# Patient Record
Sex: Male | Born: 1935 | Race: White | Hispanic: No | Marital: Married | Smoking: Former smoker
Health system: Southern US, Community
[De-identification: ages and names within clinical notes are randomized; demographics above are authoritative.]

## PROBLEM LIST (undated history)

## (undated) DIAGNOSIS — E119 Type 2 diabetes mellitus without complications: Secondary | ICD-10-CM

## (undated) DIAGNOSIS — I2510 Atherosclerotic heart disease of native coronary artery without angina pectoris: Secondary | ICD-10-CM

## (undated) DIAGNOSIS — S0990XA Unspecified injury of head, initial encounter: Secondary | ICD-10-CM

## (undated) HISTORY — PX: PACEMAKER INSERTION: SHX728

## (undated) HISTORY — PX: OTHER SURGICAL HISTORY: SHX169

## (undated) HISTORY — PX: APPENDECTOMY: SHX54

## (undated) HISTORY — PX: KNEE ARTHROSCOPY: SUR90

## (undated) HISTORY — PX: INSERT / REPLACE / REMOVE PACEMAKER: SUR710

---

## 2006-12-26 ENCOUNTER — Inpatient Hospital Stay (HOSPITAL_COMMUNITY): Admission: RE | Admit: 2006-12-26 | Discharge: 2006-12-31 | Payer: Self-pay | Admitting: Orthopedic Surgery

## 2011-05-03 LAB — PROTIME-INR
INR: 3.2 — ABNORMAL HIGH
Prothrombin Time: 34.4 — ABNORMAL HIGH

## 2011-05-04 LAB — CBC
HCT: 25.9 — ABNORMAL LOW
HCT: 27.8 — ABNORMAL LOW
HCT: 35.4 — ABNORMAL LOW
Hemoglobin: 11.9 — ABNORMAL LOW
Hemoglobin: 8.8 — ABNORMAL LOW
Hemoglobin: 9.6 — ABNORMAL LOW
MCHC: 33.6
MCHC: 34.5
MCV: 89.3
MCV: 89.3
MCV: 89.5
Platelets: 129 — ABNORMAL LOW
RBC: 3.11 — ABNORMAL LOW
RBC: 3.96 — ABNORMAL LOW
RDW: 13.8
RDW: 13.8
WBC: 9.3

## 2011-05-04 LAB — BASIC METABOLIC PANEL
BUN: 15
CO2: 27
CO2: 30
Calcium: 8.7
Chloride: 101
Chloride: 105
Glucose, Bld: 198 — ABNORMAL HIGH
Glucose, Bld: 257 — ABNORMAL HIGH
Potassium: 4.4
Sodium: 135
Sodium: 137

## 2011-05-04 LAB — TYPE AND SCREEN: ABO/RH(D): A POS

## 2011-05-04 LAB — PROTIME-INR
INR: 1.5
Prothrombin Time: 14

## 2013-04-17 ENCOUNTER — Emergency Department (HOSPITAL_COMMUNITY): Payer: Medicare Other

## 2013-04-17 ENCOUNTER — Emergency Department (HOSPITAL_COMMUNITY)
Admission: EM | Admit: 2013-04-17 | Discharge: 2013-04-18 | Disposition: A | Discharge status: Home / Self Care | Payer: Medicare Other | Attending: Emergency Medicine | Admitting: Emergency Medicine

## 2013-04-17 ENCOUNTER — Encounter (HOSPITAL_COMMUNITY): Payer: Self-pay | Admitting: Adult Health

## 2013-04-17 DIAGNOSIS — Z79899 Other long term (current) drug therapy: Secondary | ICD-10-CM | POA: Insufficient documentation

## 2013-04-17 DIAGNOSIS — R197 Diarrhea, unspecified: Secondary | ICD-10-CM | POA: Insufficient documentation

## 2013-04-17 DIAGNOSIS — E876 Hypokalemia: Secondary | ICD-10-CM | POA: Insufficient documentation

## 2013-04-17 DIAGNOSIS — R51 Headache: Secondary | ICD-10-CM | POA: Insufficient documentation

## 2013-04-17 DIAGNOSIS — G8929 Other chronic pain: Secondary | ICD-10-CM | POA: Insufficient documentation

## 2013-04-17 DIAGNOSIS — Z7982 Long term (current) use of aspirin: Secondary | ICD-10-CM | POA: Insufficient documentation

## 2013-04-17 DIAGNOSIS — E119 Type 2 diabetes mellitus without complications: Secondary | ICD-10-CM | POA: Insufficient documentation

## 2013-04-17 DIAGNOSIS — I2510 Atherosclerotic heart disease of native coronary artery without angina pectoris: Secondary | ICD-10-CM | POA: Insufficient documentation

## 2013-04-17 DIAGNOSIS — Z950 Presence of cardiac pacemaker: Secondary | ICD-10-CM | POA: Insufficient documentation

## 2013-04-17 HISTORY — DX: Atherosclerotic heart disease of native coronary artery without angina pectoris: I25.10

## 2013-04-17 HISTORY — DX: Unspecified injury of head, initial encounter: S09.90XA

## 2013-04-17 HISTORY — DX: Type 2 diabetes mellitus without complications: E11.9

## 2013-04-17 LAB — CBC
HCT: 36.7 % — ABNORMAL LOW (ref 39.0–52.0)
MCH: 29.2 pg (ref 26.0–34.0)
MCV: 81.9 fL (ref 78.0–100.0)
Platelets: 181 10*3/uL (ref 150–400)
RDW: 13.1 % (ref 11.5–15.5)
WBC: 4 10*3/uL (ref 4.0–10.5)

## 2013-04-17 LAB — BASIC METABOLIC PANEL
BUN: 23 mg/dL (ref 6–23)
CO2: 24 mEq/L (ref 19–32)
Calcium: 9.4 mg/dL (ref 8.4–10.5)
Chloride: 92 mEq/L — ABNORMAL LOW (ref 96–112)
Creatinine, Ser: 1.23 mg/dL (ref 0.50–1.35)

## 2013-04-17 LAB — URINALYSIS, ROUTINE W REFLEX MICROSCOPIC
Bilirubin Urine: NEGATIVE
Glucose, UA: NEGATIVE mg/dL
Ketones, ur: NEGATIVE mg/dL
Protein, ur: NEGATIVE mg/dL
pH: 6.5 (ref 5.0–8.0)

## 2013-04-17 MED ORDER — SODIUM CHLORIDE 0.9 % IV BOLUS (SEPSIS)
1000.0000 mL | Freq: Once | INTRAVENOUS | Status: AC
  Administered 2013-04-17: 1000 mL via INTRAVENOUS

## 2013-04-17 MED ORDER — METOCLOPRAMIDE HCL 5 MG/ML IJ SOLN
5.0000 mg | Freq: Once | INTRAMUSCULAR | Status: AC
  Administered 2013-04-17: 5 mg via INTRAVENOUS
  Filled 2013-04-17: qty 2

## 2013-04-17 MED ORDER — POTASSIUM CHLORIDE ER 10 MEQ PO TBCR: 10.0000 meq | EXTENDED_RELEASE_TABLET | Freq: Two times a day (BID) | ORAL | Status: DC

## 2013-04-17 MED ORDER — DIPHENHYDRAMINE HCL 50 MG/ML IJ SOLN
12.5000 mg | Freq: Once | INTRAMUSCULAR | Status: AC
  Administered 2013-04-17: 12.5 mg via INTRAVENOUS
  Filled 2013-04-17: qty 1

## 2013-04-17 NOTE — ED Provider Notes (Signed)
CSN: [id number]     Arrival date & time 04/17/13  1555 History   First MD Initiated Contact with Patient 04/17/13 1608     Chief Complaint  Patient presents with  . Headache   (Consider location/radiation/quality/duration/timing/severity/associated sxs/prior Treatment) Patient is a 79 y.o. male presenting with headaches.  Headache Pain location:  Occipital Quality:  Dull Radiates to:  Does not radiate Onset quality:  Gradual Duration: chronically since 1998, worse over past 2-3 days. Timing:  Constant Progression:  Worsening Chronicity:  Chronic Similar to prior headaches: yes   Context: activity   Relieved by:  Nothing Worsened by:  Nothing tried Associated symptoms: diarrhea   Associated symptoms: no abdominal pain, no back pain, no congestion, no cough, no dizziness, no fever, no nausea, no photophobia, no seizures, no sinus pressure, no sore throat, no URI and no vomiting     Past Medical History  Diagnosis Date  . Diabetes mellitus without complication   . Coronary artery disease   . Head injury    Past Surgical History  Procedure Laterality Date  . Pacemaker insertion    . Knee arthroscopy     History reviewed. No pertinent family history. History  Substance Use Topics  . Smoking status: Never Smoker   . Smokeless tobacco: Not on file  . Alcohol Use: No    Review of Systems  Constitutional: Negative for fever and chills.  HENT: Negative for congestion, sore throat, rhinorrhea and sinus pressure.   Eyes: Negative for photophobia and visual disturbance.  Respiratory: Negative for cough and shortness of breath.   Cardiovascular: Negative for chest pain and leg swelling.  Gastrointestinal: Positive for diarrhea. Negative for nausea, vomiting, abdominal pain and constipation.  Endocrine: Negative for polydipsia and polyuria.  Genitourinary: Negative for dysuria and hematuria.  Musculoskeletal: Negative for back pain and arthralgias.  Skin: Negative for color  change and rash.  Neurological: Positive for headaches. Negative for dizziness, seizures, syncope and light-headedness.  Hematological: Negative for adenopathy. Does not bruise/bleed easily.  All other systems reviewed and are negative.    Allergies  Tylenol  Home Medications   Current Outpatient Rx  Name  Route  Sig  Dispense  Refill  . aspirin 81 MG tablet   Oral   Take 81 mg by mouth daily.         . Calcium-Magnesium-Vitamin D (CALCIUM 500 PO)   Oral   Take 1 tablet by mouth daily.         . Cholecalciferol (VITAMIN D PO)   Oral   Take 1 tablet by mouth daily.         . Coenzyme Q10 (CO Q 10 PO)   Oral   Take 1 tablet by mouth daily.         . Cyanocobalamin (VITAMIN B 12 PO)   Oral   Take 1 tablet by mouth daily.         Marland Kitchen lisinopril (PRINIVIL,ZESTRIL) 2.5 MG tablet   Oral   Take 2.5 mg by mouth daily.         . metFORMIN (GLUCOPHAGE) 500 MG tablet   Oral   Take 500 mg by mouth 2 (two) times daily with a meal.         . Omega-3 Fatty Acids (FISH OIL PO)   Oral   Take 1 capsule by mouth 2 (two) times daily.         . potassium chloride (K-DUR) 10 MEQ tablet   Oral   Take  1 tablet (10 mEq total) by mouth 2 (two) times daily.   6 tablet   0    BP 145/95  Pulse 83  Temp(Src) 98.7 F (37.1 C) (Oral)  Resp 18  SpO2 98% Physical Exam  Vitals reviewed. Constitutional: He is oriented to person, place, and time. He appears well-developed and well-nourished.  HENT:  Head: Normocephalic and atraumatic.  Eyes: Conjunctivae and EOM are normal.  Neck: Normal range of motion. Neck supple.  Cardiovascular: Normal rate, regular rhythm and normal heart sounds.   Pulmonary/Chest: Effort normal and breath sounds normal. No respiratory distress.  Abdominal: He exhibits no distension. There is no tenderness. There is no rebound and no guarding.  Musculoskeletal: Normal range of motion.  Neurological: He is alert and oriented to person, place, and  time. No cranial nerve deficit or sensory deficit. Gait (chronically) abnormal. GCS eye subscore is 4. GCS verbal subscore is 5. GCS motor subscore is 6.  R sided leg weakness and atrophy chronic since childhood with diminished reflexes.    Skin: Skin is warm and dry.    ED Course  Procedures (including critical care time) Labs Review Labs Reviewed  CBC - Abnormal; Notable for the following:    HCT 36.7 (*)    All other components within normal limits  BASIC METABOLIC PANEL - Abnormal; Notable for the following:    Sodium 127 (*)    Chloride 92 (*)    Glucose, Bld 135 (*)    GFR calc non Af Amer 54 (*)    GFR calc Af Amer 63 (*)    All other components within normal limits  URINALYSIS, ROUTINE W REFLEX MICROSCOPIC - Abnormal; Notable for the following:    APPearance HAZY (*)    All other components within normal limits   Imaging Review Ct Head Wo Contrast  04/17/2013   *RADIOLOGY REPORT*  Clinical Data: Posterior headache for 2 days  CT HEAD WITHOUT CONTRAST  Technique:  Contiguous axial images were obtained from the base of the skull through the vertex without contrast.  Comparison: Prior head CT 01/16/2008  Findings: No acute intracranial hemorrhage, acute infarction, mass lesion, mass effect, midline shift or hydrocephalus.  Gray-white differentiation is preserved throughout.  Slight interval progression of the cerebral and cerebellar atrophy compared to 2009.  Bilateral globes and orbits are intact and unremarkable.  No acute soft tissue abnormality.  Unremarkable calvarium without lytic or blastic osseous lesion.  Normal aeration of the bilateral mastoid air cells and visualized paranasal sinuses.  Trace atherosclerotic calcification of the cavernous carotid arteries bilaterally.  IMPRESSION:  1.  No acute intracranial abnormality. 2.  Mild interval progression of cerebral atrophy compared to 2009.   Original Report Authenticated By: Jacqulynn Cadet, M.D.    MDM   1. Headache    2. Hypokalemia    79 y.o. male  with pertinent PMH of DM, CAD, chronic ha presents with acute on chronic ha x 3 days.  HA chronic and daily for 16 years, however now bad enough that he has not been sleeping.  Has also had diarrhea over the past 3 days.  No fevers, vomiting, chest pain, dyspnea, or other findings.  Physical exam unremarkable for acute conditions, no new focal neuro deficits.  Labs and imaging as above did not reveal likely etiology of headache, although they did demonstrate hyponatremia.  NS bolus given, informed pt and family member to increase salt intake over next 2-3 days to fu with neurolgy for persistent ha.  Doubt SAH, SDH, Meningitis, or other acute pathology to explain symptoms, likely acute exacerbation of chronic process.  Given standard return precautions, agreed to fu.    Labs and imaging as above reviewed by myself and attending,Dr. Dina Rich, with whom case was discussed.   1. Headache   2. Hypokalemia         Rexene Agent, MD 04/18/13 [phone]

## 2013-04-17 NOTE — ED Notes (Signed)
Present with headache since 1998, worsening over the last 2-3 days headache is worse in occiput associated with sensitivity to sound. Denies nausea, denies sensitivity to light. Last week, on Tuesday pt reports having a episode of garbled speech that spontaneously resolved. Pt alert, oriented, no drift, droop or weakness.

## 2013-04-18 NOTE — ED Provider Notes (Signed)
I saw and evaluated the patient, reviewed the resident's note and I agree with the findings and plan.  This is a 79 year old male who presents with chronic headaches with acute worsening. Patient reports headaches for the last 16 years. He reports worsening of his headache the last 3 days. He denies any fevers, neck stiffness, nausea vomiting. He denies any weakness, numbness, or tingling. His exam is unremarkable and his neurologic exam is benign.  Patient was noted to be hyponatremic to 127. He was given a normal saline bolus. He is to followup with his doctor for recheck in one week. Patient was given headache cocktail with some improvement of his symptoms.  Of note, resident note gives diagnosis of hyperkalemia. This should read hyponatremia.  Merryl Hacker, MD 04/18/13 [phone]

## 2013-04-28 ENCOUNTER — Encounter: Payer: Self-pay | Admitting: Neurology

## 2013-04-28 ENCOUNTER — Ambulatory Visit (INDEPENDENT_AMBULATORY_CARE_PROVIDER_SITE_OTHER): Payer: Medicare Other | Admitting: Neurology

## 2013-04-28 VITALS — BP 131/82 | HR 83 | Ht 66.0 in | Wt 209.2 lb

## 2013-04-28 DIAGNOSIS — M5030 Other cervical disc degeneration, unspecified cervical region: Secondary | ICD-10-CM

## 2013-04-28 DIAGNOSIS — R51 Headache: Secondary | ICD-10-CM

## 2013-04-28 DIAGNOSIS — G243 Spasmodic torticollis: Secondary | ICD-10-CM | POA: Insufficient documentation

## 2013-04-28 NOTE — Patient Instructions (Signed)
Overall you are doing fairly well but I do want to suggest a few things today:   As far as your medications are concerned, I would like to suggest  As far as diagnostic testing: We will get a MRI of the neck to check for changes to the cervical spine  Based on the MRI we can consider different therapeutic options  I would like to see you back once the MRI is complete. Please schedule a follow up once you know your MRI date. Please call us with any interim questions, concerns, problems, updates or refill requests.   Please also call us for any test results so we can go over those with you on the phone.  My clinical assistant and will answer any of your questions and relay your messages to me and also relay most of my messages to you.   Our phone number is [phone]. We also have an after hours call service for urgent matters and there is a physician on-call for urgent questions. For any emergencies you know to call 911 or go to the nearest emergency room
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
## 2013-05-07 ENCOUNTER — Telehealth: Payer: Self-pay | Admitting: Neurology

## 2013-05-08 NOTE — Telephone Encounter (Signed)
Message left for patient. Will try again at later date.

## 2014-06-14 IMAGING — CT CT HEAD W/O CM
2 series · 16 of 30 positions shown, 20 images · non-contrast
Comparison: Prior head CT 01/16/2008

CLINICAL DATA: Posterior headache for 2 days

CT HEAD WITHOUT CONTRAST
TECHNIQUE: Contiguous axial images were obtained from the base of
the skull through the vertex without contrast.

[Series 2: head w/o · axial · non-contrast · 0.49mm/px · z∈[+73,+198]mm · 13 of 31 slices shown, 17 images]
[im 3/31  brain]
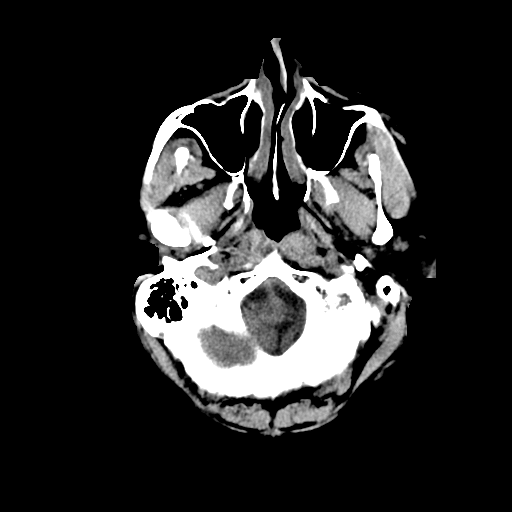
[im 3/31  bone]
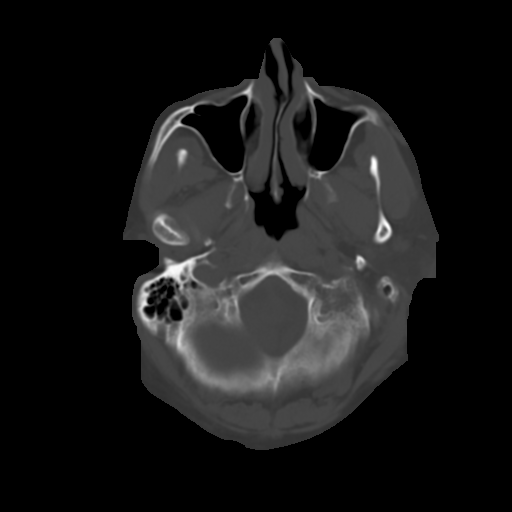
[im 5/31  brain]
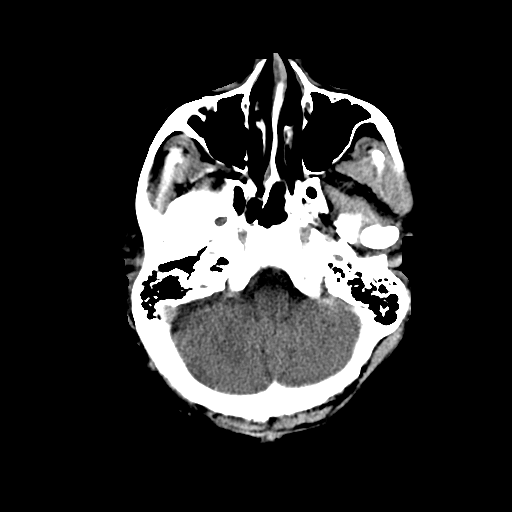
[im 7/31  brain]
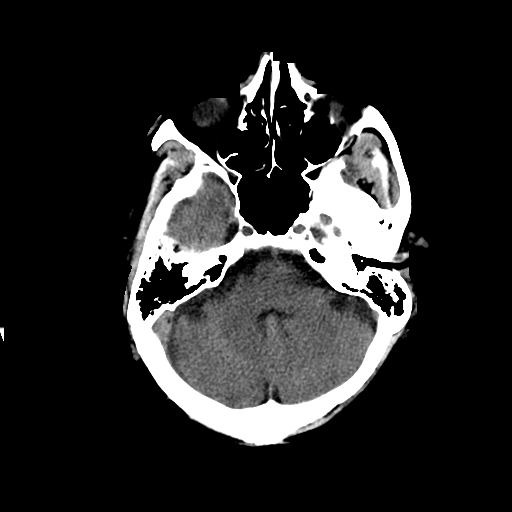
[im 9/31  brain]
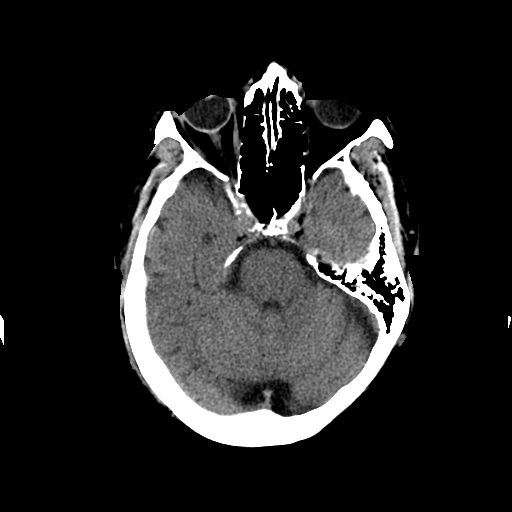
[im 11/31  brain]
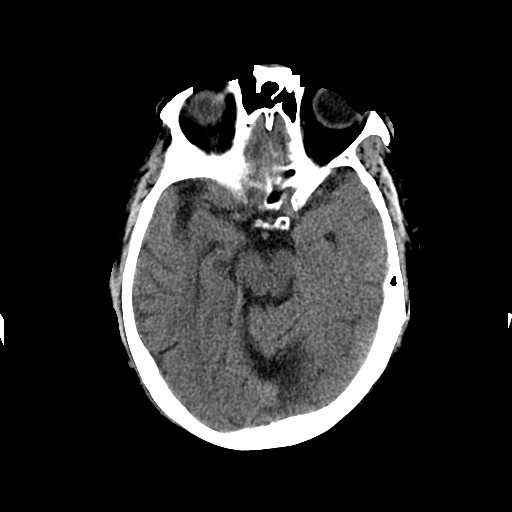
[im 11/31  bone]
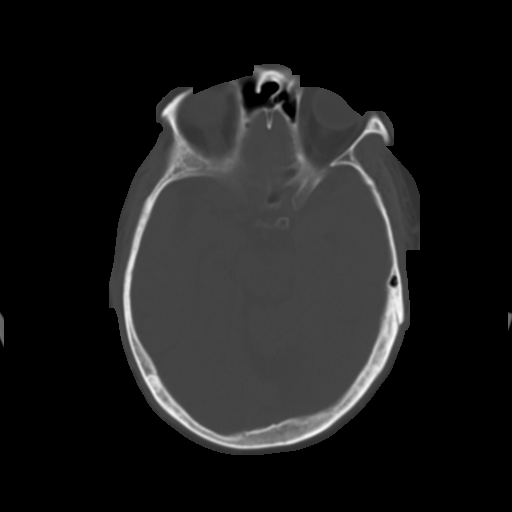
[im 13/31  brain]
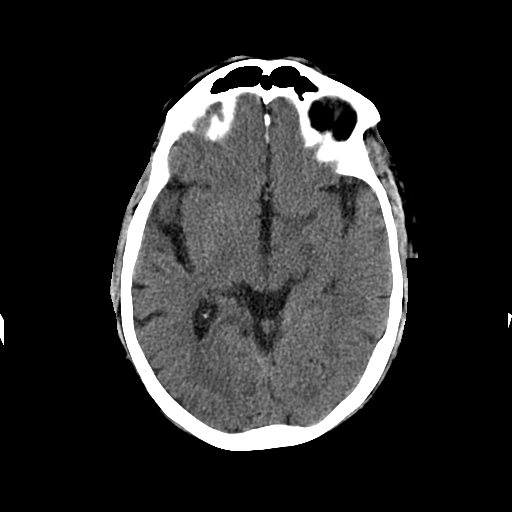
[im 16/31  brain]
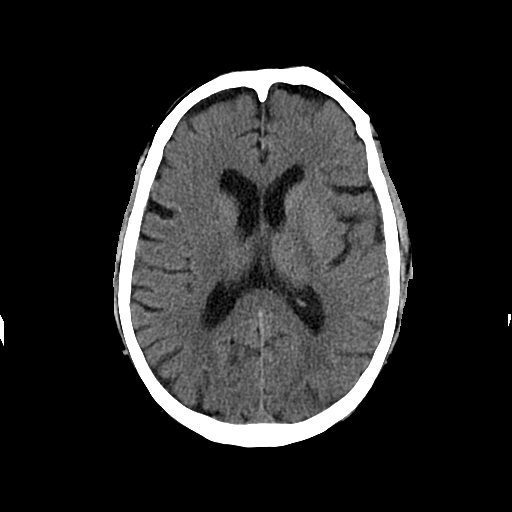
[im 18/31  brain]
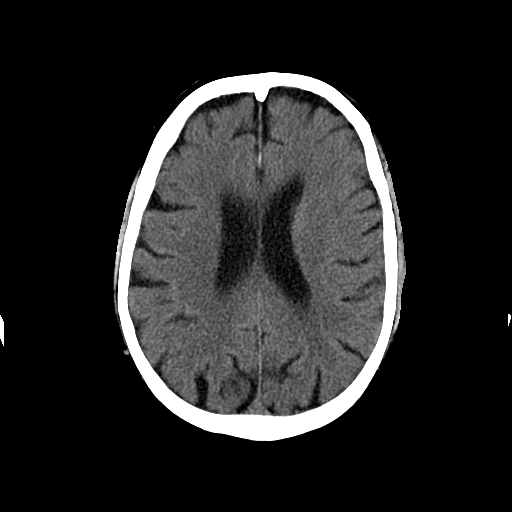
[im 20/31  brain]
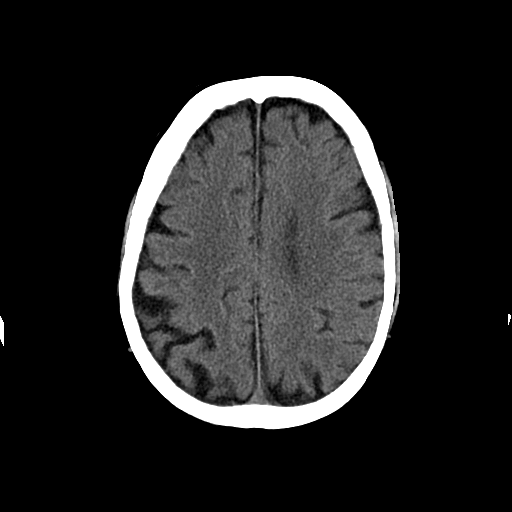
[im 20/31  bone]
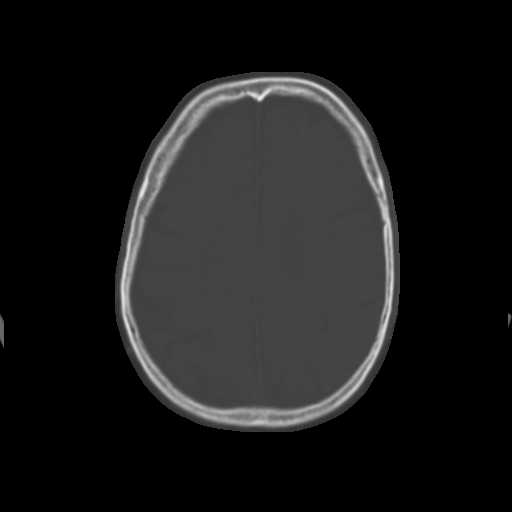
[im 22/31  brain]
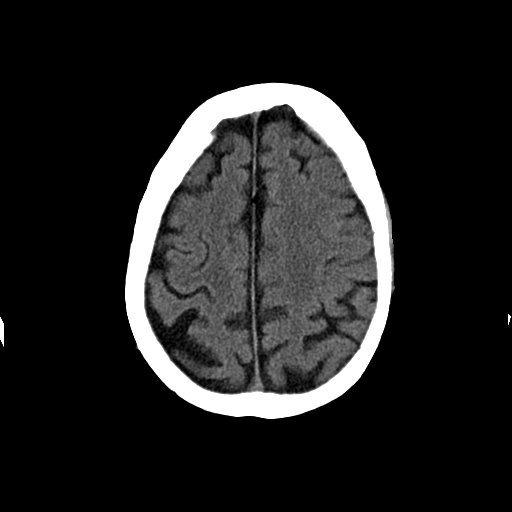
[im 24/31  brain]
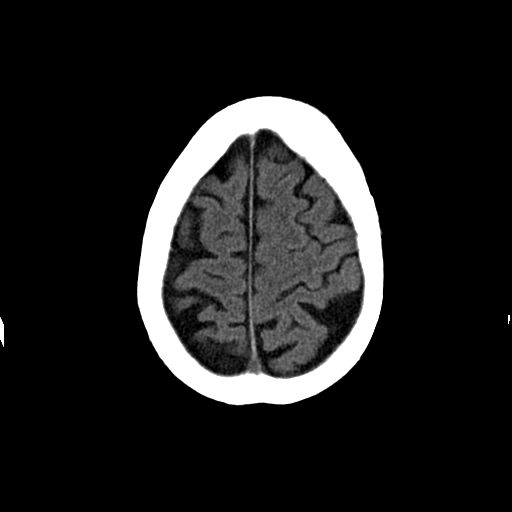
[im 26/31  brain]
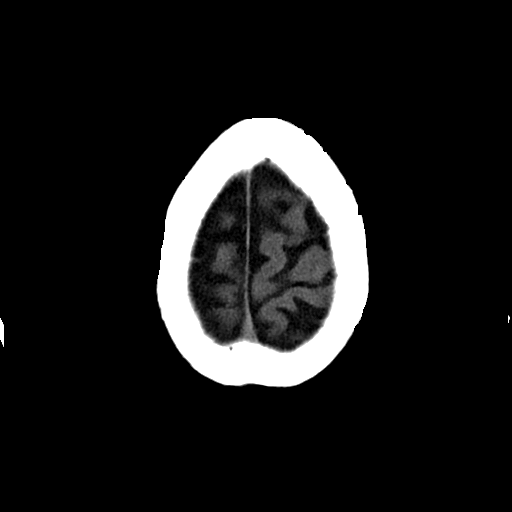
[im 28/31  brain]
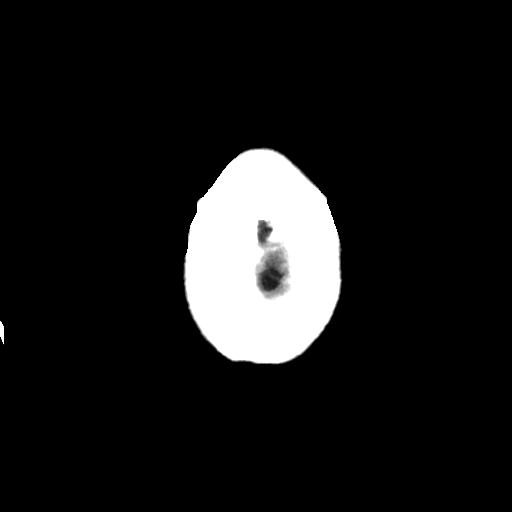
[im 28/31  bone]
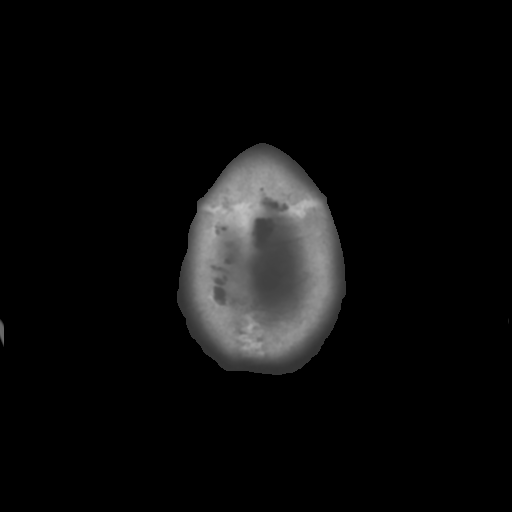

[Series 3: head w/o bone · axial · non-contrast · 0.49mm/px · z∈[+73,+113]mm · 3 of 31 slices shown]
[im 3/31  bone]
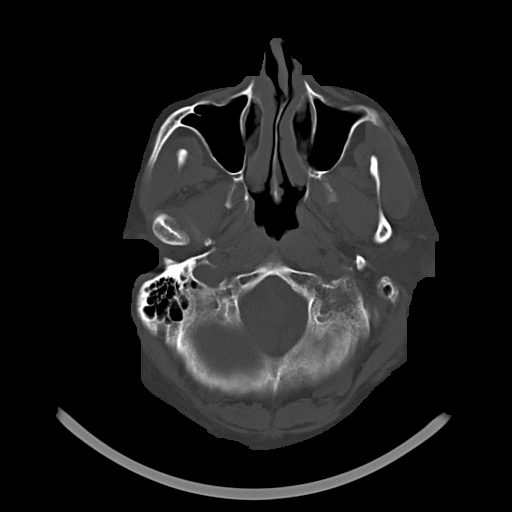
[im 7/31  bone]
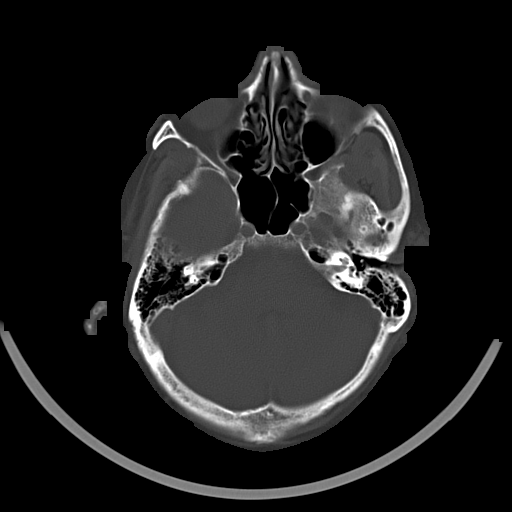
[im 11/31  bone]
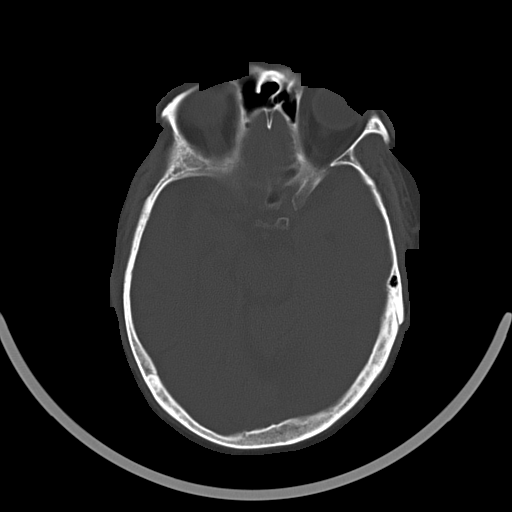

[16 of 30 positions shown; findings below may reference images not displayed]

FINDINGS: No acute intracranial hemorrhage, acute infarction, mass
lesion, mass effect, midline shift or hydrocephalus.  Gray-white
differentiation is preserved throughout.  Slight interval
progression of the cerebral and cerebellar atrophy compared to
4221.  Bilateral globes and orbits are intact and unremarkable.  No
acute soft tissue abnormality.  Unremarkable calvarium without
lytic or blastic osseous lesion.  Normal aeration of the bilateral
mastoid air cells and visualized paranasal sinuses.  Trace
atherosclerotic calcification of the cavernous carotid arteries
bilaterally.
IMPRESSION: 1.  No acute intracranial abnormality.
2.  Mild interval progression of cerebral atrophy compared to 4221.

## 2014-07-08 DIAGNOSIS — Z86711 Personal history of pulmonary embolism: Secondary | ICD-10-CM | POA: Insufficient documentation

## 2015-11-11 DIAGNOSIS — R001 Bradycardia, unspecified: Secondary | ICD-10-CM | POA: Insufficient documentation

## 2015-11-11 DIAGNOSIS — Z45018 Encounter for adjustment and management of other part of cardiac pacemaker: Secondary | ICD-10-CM | POA: Insufficient documentation

## 2016-08-04 DIAGNOSIS — R0789 Other chest pain: Secondary | ICD-10-CM | POA: Diagnosis not present

## 2016-08-04 DIAGNOSIS — R079 Chest pain, unspecified: Secondary | ICD-10-CM | POA: Diagnosis not present

## 2016-08-08 DIAGNOSIS — R001 Bradycardia, unspecified: Secondary | ICD-10-CM | POA: Diagnosis not present

## 2016-08-08 DIAGNOSIS — Z45018 Encounter for adjustment and management of other part of cardiac pacemaker: Secondary | ICD-10-CM | POA: Diagnosis not present

## 2016-08-08 DIAGNOSIS — I10 Essential (primary) hypertension: Secondary | ICD-10-CM | POA: Diagnosis not present

## 2016-08-08 DIAGNOSIS — E785 Hyperlipidemia, unspecified: Secondary | ICD-10-CM | POA: Insufficient documentation

## 2016-08-08 DIAGNOSIS — Z794 Long term (current) use of insulin: Secondary | ICD-10-CM | POA: Diagnosis not present

## 2016-08-08 DIAGNOSIS — E119 Type 2 diabetes mellitus without complications: Secondary | ICD-10-CM | POA: Insufficient documentation

## 2016-08-08 DIAGNOSIS — E784 Other hyperlipidemia: Secondary | ICD-10-CM | POA: Diagnosis not present

## 2016-08-10 DIAGNOSIS — Z9181 History of falling: Secondary | ICD-10-CM | POA: Diagnosis not present

## 2016-08-10 DIAGNOSIS — E1129 Type 2 diabetes mellitus with other diabetic kidney complication: Secondary | ICD-10-CM | POA: Diagnosis not present

## 2016-08-10 DIAGNOSIS — R809 Proteinuria, unspecified: Secondary | ICD-10-CM | POA: Diagnosis not present

## 2016-08-10 DIAGNOSIS — Z794 Long term (current) use of insulin: Secondary | ICD-10-CM | POA: Diagnosis not present

## 2016-08-10 DIAGNOSIS — N3001 Acute cystitis with hematuria: Secondary | ICD-10-CM | POA: Diagnosis not present

## 2016-08-14 DIAGNOSIS — E119 Type 2 diabetes mellitus without complications: Secondary | ICD-10-CM | POA: Diagnosis not present

## 2016-08-14 DIAGNOSIS — Z45018 Encounter for adjustment and management of other part of cardiac pacemaker: Secondary | ICD-10-CM | POA: Diagnosis not present

## 2016-08-14 DIAGNOSIS — R001 Bradycardia, unspecified: Secondary | ICD-10-CM | POA: Diagnosis not present

## 2016-08-14 DIAGNOSIS — E784 Other hyperlipidemia: Secondary | ICD-10-CM | POA: Diagnosis not present

## 2016-08-14 DIAGNOSIS — Z794 Long term (current) use of insulin: Secondary | ICD-10-CM | POA: Diagnosis not present

## 2016-08-14 DIAGNOSIS — I10 Essential (primary) hypertension: Secondary | ICD-10-CM | POA: Diagnosis not present

## 2016-09-11 DIAGNOSIS — Z45018 Encounter for adjustment and management of other part of cardiac pacemaker: Secondary | ICD-10-CM | POA: Diagnosis not present

## 2016-09-11 DIAGNOSIS — E119 Type 2 diabetes mellitus without complications: Secondary | ICD-10-CM | POA: Diagnosis not present

## 2016-09-11 DIAGNOSIS — R001 Bradycardia, unspecified: Secondary | ICD-10-CM | POA: Diagnosis not present

## 2016-09-11 DIAGNOSIS — I10 Essential (primary) hypertension: Secondary | ICD-10-CM | POA: Diagnosis not present

## 2016-09-11 DIAGNOSIS — Z794 Long term (current) use of insulin: Secondary | ICD-10-CM | POA: Diagnosis not present

## 2016-09-11 DIAGNOSIS — E784 Other hyperlipidemia: Secondary | ICD-10-CM | POA: Diagnosis not present

## 2016-09-13 DIAGNOSIS — M542 Cervicalgia: Secondary | ICD-10-CM | POA: Diagnosis not present

## 2016-09-15 DIAGNOSIS — E1129 Type 2 diabetes mellitus with other diabetic kidney complication: Secondary | ICD-10-CM | POA: Diagnosis not present

## 2016-09-22 DIAGNOSIS — I10 Essential (primary) hypertension: Secondary | ICD-10-CM | POA: Diagnosis not present

## 2016-09-22 DIAGNOSIS — E119 Type 2 diabetes mellitus without complications: Secondary | ICD-10-CM | POA: Diagnosis not present

## 2016-09-22 DIAGNOSIS — E784 Other hyperlipidemia: Secondary | ICD-10-CM | POA: Diagnosis not present

## 2016-09-22 DIAGNOSIS — Z45018 Encounter for adjustment and management of other part of cardiac pacemaker: Secondary | ICD-10-CM | POA: Diagnosis not present

## 2016-09-22 DIAGNOSIS — R001 Bradycardia, unspecified: Secondary | ICD-10-CM | POA: Diagnosis not present

## 2016-09-22 DIAGNOSIS — Z794 Long term (current) use of insulin: Secondary | ICD-10-CM | POA: Diagnosis not present

## 2016-09-22 DIAGNOSIS — E785 Hyperlipidemia, unspecified: Secondary | ICD-10-CM | POA: Diagnosis not present

## 2016-09-28 DIAGNOSIS — E1165 Type 2 diabetes mellitus with hyperglycemia: Secondary | ICD-10-CM | POA: Diagnosis not present

## 2016-09-28 DIAGNOSIS — Z8781 Personal history of (healed) traumatic fracture: Secondary | ICD-10-CM | POA: Diagnosis not present

## 2016-09-28 DIAGNOSIS — E1129 Type 2 diabetes mellitus with other diabetic kidney complication: Secondary | ICD-10-CM | POA: Diagnosis not present

## 2016-09-28 DIAGNOSIS — Z6834 Body mass index (BMI) 34.0-34.9, adult: Secondary | ICD-10-CM | POA: Diagnosis not present

## 2016-09-28 DIAGNOSIS — G4700 Insomnia, unspecified: Secondary | ICD-10-CM | POA: Diagnosis not present

## 2016-09-28 DIAGNOSIS — M1711 Unilateral primary osteoarthritis, right knee: Secondary | ICD-10-CM | POA: Diagnosis not present

## 2016-09-28 DIAGNOSIS — Z8739 Personal history of other diseases of the musculoskeletal system and connective tissue: Secondary | ICD-10-CM | POA: Diagnosis not present

## 2016-10-06 DIAGNOSIS — R2689 Other abnormalities of gait and mobility: Secondary | ICD-10-CM | POA: Diagnosis not present

## 2016-10-06 DIAGNOSIS — M6281 Muscle weakness (generalized): Secondary | ICD-10-CM | POA: Diagnosis not present

## 2016-10-11 DIAGNOSIS — E1129 Type 2 diabetes mellitus with other diabetic kidney complication: Secondary | ICD-10-CM | POA: Diagnosis not present

## 2016-10-11 DIAGNOSIS — M6281 Muscle weakness (generalized): Secondary | ICD-10-CM | POA: Diagnosis not present

## 2016-10-11 DIAGNOSIS — E1165 Type 2 diabetes mellitus with hyperglycemia: Secondary | ICD-10-CM | POA: Diagnosis not present

## 2016-10-11 DIAGNOSIS — R2689 Other abnormalities of gait and mobility: Secondary | ICD-10-CM | POA: Diagnosis not present

## 2016-10-16 DIAGNOSIS — M6281 Muscle weakness (generalized): Secondary | ICD-10-CM | POA: Diagnosis not present

## 2016-10-16 DIAGNOSIS — R2689 Other abnormalities of gait and mobility: Secondary | ICD-10-CM | POA: Diagnosis not present

## 2016-10-18 DIAGNOSIS — R2689 Other abnormalities of gait and mobility: Secondary | ICD-10-CM | POA: Diagnosis not present

## 2016-10-18 DIAGNOSIS — M6281 Muscle weakness (generalized): Secondary | ICD-10-CM | POA: Diagnosis not present

## 2016-10-20 DIAGNOSIS — E1129 Type 2 diabetes mellitus with other diabetic kidney complication: Secondary | ICD-10-CM | POA: Diagnosis not present

## 2016-10-20 DIAGNOSIS — E1165 Type 2 diabetes mellitus with hyperglycemia: Secondary | ICD-10-CM | POA: Diagnosis not present

## 2016-10-20 DIAGNOSIS — Z6835 Body mass index (BMI) 35.0-35.9, adult: Secondary | ICD-10-CM | POA: Diagnosis not present

## 2016-10-23 DIAGNOSIS — M6281 Muscle weakness (generalized): Secondary | ICD-10-CM | POA: Diagnosis not present

## 2016-10-23 DIAGNOSIS — R2689 Other abnormalities of gait and mobility: Secondary | ICD-10-CM | POA: Diagnosis not present

## 2016-10-25 DIAGNOSIS — N183 Chronic kidney disease, stage 3 (moderate): Secondary | ICD-10-CM | POA: Diagnosis not present

## 2016-10-25 DIAGNOSIS — R2689 Other abnormalities of gait and mobility: Secondary | ICD-10-CM | POA: Diagnosis not present

## 2016-10-25 DIAGNOSIS — M6281 Muscle weakness (generalized): Secondary | ICD-10-CM | POA: Diagnosis not present

## 2016-10-25 DIAGNOSIS — D631 Anemia in chronic kidney disease: Secondary | ICD-10-CM | POA: Diagnosis not present

## 2016-10-25 DIAGNOSIS — Z6835 Body mass index (BMI) 35.0-35.9, adult: Secondary | ICD-10-CM | POA: Diagnosis not present

## 2016-10-25 DIAGNOSIS — N2581 Secondary hyperparathyroidism of renal origin: Secondary | ICD-10-CM | POA: Diagnosis not present

## 2016-11-01 DIAGNOSIS — M6281 Muscle weakness (generalized): Secondary | ICD-10-CM | POA: Diagnosis not present

## 2016-11-01 DIAGNOSIS — R2689 Other abnormalities of gait and mobility: Secondary | ICD-10-CM | POA: Diagnosis not present

## 2016-11-02 ENCOUNTER — Other Ambulatory Visit: Payer: Self-pay | Admitting: Nephrology

## 2016-11-02 DIAGNOSIS — N183 Chronic kidney disease, stage 3 unspecified: Secondary | ICD-10-CM

## 2016-11-08 DIAGNOSIS — R2689 Other abnormalities of gait and mobility: Secondary | ICD-10-CM | POA: Diagnosis not present

## 2016-11-08 DIAGNOSIS — M6281 Muscle weakness (generalized): Secondary | ICD-10-CM | POA: Diagnosis not present

## 2016-11-09 ENCOUNTER — Ambulatory Visit
Admission: RE | Admit: 2016-11-09 | Discharge: 2016-11-09 | Disposition: A | Discharge status: Home / Self Care | Payer: Self-pay | Source: Ambulatory Visit | Attending: Nephrology | Admitting: Nephrology

## 2016-11-09 DIAGNOSIS — N183 Chronic kidney disease, stage 3 unspecified: Secondary | ICD-10-CM

## 2016-11-09 DIAGNOSIS — N281 Cyst of kidney, acquired: Secondary | ICD-10-CM | POA: Diagnosis not present

## 2016-11-12 DIAGNOSIS — S91111A Laceration without foreign body of right great toe without damage to nail, initial encounter: Secondary | ICD-10-CM | POA: Diagnosis not present

## 2016-11-12 DIAGNOSIS — S92911B Unspecified fracture of right toe(s), initial encounter for open fracture: Secondary | ICD-10-CM | POA: Diagnosis not present

## 2016-11-12 DIAGNOSIS — S91301A Unspecified open wound, right foot, initial encounter: Secondary | ICD-10-CM | POA: Diagnosis not present

## 2016-11-12 DIAGNOSIS — S99921A Unspecified injury of right foot, initial encounter: Secondary | ICD-10-CM | POA: Diagnosis not present

## 2016-11-12 DIAGNOSIS — S92511A Displaced fracture of proximal phalanx of right lesser toe(s), initial encounter for closed fracture: Secondary | ICD-10-CM | POA: Diagnosis not present

## 2016-11-12 DIAGNOSIS — S91114A Laceration without foreign body of right lesser toe(s) without damage to nail, initial encounter: Secondary | ICD-10-CM | POA: Diagnosis not present

## 2016-11-12 DIAGNOSIS — W228XXA Striking against or struck by other objects, initial encounter: Secondary | ICD-10-CM | POA: Diagnosis not present

## 2016-11-12 DIAGNOSIS — S92501A Displaced unspecified fracture of right lesser toe(s), initial encounter for closed fracture: Secondary | ICD-10-CM | POA: Diagnosis not present

## 2016-11-13 DIAGNOSIS — R2689 Other abnormalities of gait and mobility: Secondary | ICD-10-CM | POA: Diagnosis not present

## 2016-11-13 DIAGNOSIS — M6281 Muscle weakness (generalized): Secondary | ICD-10-CM | POA: Diagnosis not present

## 2016-11-17 ENCOUNTER — Encounter: Payer: Self-pay | Admitting: Sports Medicine

## 2016-11-17 ENCOUNTER — Ambulatory Visit (INDEPENDENT_AMBULATORY_CARE_PROVIDER_SITE_OTHER): Payer: PPO | Admitting: Sports Medicine

## 2016-11-17 DIAGNOSIS — M79674 Pain in right toe(s): Secondary | ICD-10-CM | POA: Diagnosis not present

## 2016-11-17 DIAGNOSIS — S92501A Displaced unspecified fracture of right lesser toe(s), initial encounter for closed fracture: Secondary | ICD-10-CM

## 2016-11-17 DIAGNOSIS — E1142 Type 2 diabetes mellitus with diabetic polyneuropathy: Secondary | ICD-10-CM | POA: Diagnosis not present
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
## 2016-11-20 DIAGNOSIS — M6281 Muscle weakness (generalized): Secondary | ICD-10-CM | POA: Diagnosis not present

## 2016-11-20 DIAGNOSIS — R2689 Other abnormalities of gait and mobility: Secondary | ICD-10-CM | POA: Diagnosis not present

## 2016-11-22 ENCOUNTER — Ambulatory Visit: Payer: PPO | Admitting: Sports Medicine

## 2016-11-23 DIAGNOSIS — H43811 Vitreous degeneration, right eye: Secondary | ICD-10-CM | POA: Diagnosis not present

## 2016-11-23 DIAGNOSIS — H43813 Vitreous degeneration, bilateral: Secondary | ICD-10-CM | POA: Diagnosis not present

## 2016-11-23 DIAGNOSIS — R001 Bradycardia, unspecified: Secondary | ICD-10-CM | POA: Diagnosis not present

## 2016-11-23 DIAGNOSIS — H52223 Regular astigmatism, bilateral: Secondary | ICD-10-CM | POA: Diagnosis not present

## 2016-11-23 DIAGNOSIS — Z45018 Encounter for adjustment and management of other part of cardiac pacemaker: Secondary | ICD-10-CM | POA: Diagnosis not present

## 2016-11-23 DIAGNOSIS — H524 Presbyopia: Secondary | ICD-10-CM | POA: Diagnosis not present

## 2016-11-23 DIAGNOSIS — H5213 Myopia, bilateral: Secondary | ICD-10-CM | POA: Diagnosis not present

## 2016-11-23 DIAGNOSIS — E119 Type 2 diabetes mellitus without complications: Secondary | ICD-10-CM | POA: Diagnosis not present

## 2016-12-01 ENCOUNTER — Ambulatory Visit (INDEPENDENT_AMBULATORY_CARE_PROVIDER_SITE_OTHER): Payer: PPO

## 2016-12-01 ENCOUNTER — Ambulatory Visit (INDEPENDENT_AMBULATORY_CARE_PROVIDER_SITE_OTHER): Payer: PPO | Admitting: Sports Medicine

## 2016-12-01 DIAGNOSIS — S92501A Displaced unspecified fracture of right lesser toe(s), initial encounter for closed fracture: Secondary | ICD-10-CM

## 2016-12-01 DIAGNOSIS — M79674 Pain in right toe(s): Secondary | ICD-10-CM

## 2016-12-01 DIAGNOSIS — S92501D Displaced unspecified fracture of right lesser toe(s), subsequent encounter for fracture with routine healing: Secondary | ICD-10-CM

## 2016-12-01 DIAGNOSIS — E1142 Type 2 diabetes mellitus with diabetic polyneuropathy: Secondary | ICD-10-CM | POA: Diagnosis not present

## 2016-12-01 NOTE — Progress Notes (Signed)
Subjective: Victor Murphy is a 83 y.o. male patient who returns to office for follow-up evaluation of Right toe pain and to have his sutures removed that were placed by the emergency department for skin tear. Patient is also here for x-rays to follow-up toe fracture that is present. Patient reports that there is no soreness and is doing better with physical therapy. Patient denies any other pedal complaints  FBS in the 200 range  Patient Active Problem List   Diagnosis Date Noted  . Chronic daily headache 04/28/2013  . Cervical dystonia 04/28/2013    Current Outpatient Prescriptions on File Prior to Visit  Medication Sig Dispense Refill  . allopurinol (ZYLOPRIM) 300 MG tablet     . ALPRAZolam (XANAX) 1 MG tablet     . aspirin 81 MG tablet Take 81 mg by mouth daily.    . Calcium-Magnesium-Vitamin D (CALCIUM 500 PO) Take 1 tablet by mouth daily.    . Cholecalciferol (VITAMIN D PO) Take 1 tablet by mouth daily.    . Coenzyme Q10 (CO Q 10 PO) Take 1 tablet by mouth daily.    . Cyanocobalamin (VITAMIN B 12 PO) Take 1 tablet by mouth daily.    . divalproex (DEPAKOTE) 250 MG DR tablet Take 250 mg by mouth.    . donepezil (ARICEPT) 10 MG tablet Take 10 mg by mouth.    Marland Kitchen glimepiride (AMARYL) 4 MG tablet Take 4 mg by mouth.    . Insulin Glargine (BASAGLAR KWIKPEN) 100 UNIT/ML SOPN Inject into the skin.    Marland Kitchen levothyroxine (SYNTHROID, LEVOTHROID) 25 MCG tablet Take by mouth.    Marland Kitchen lisinopril (PRINIVIL,ZESTRIL) 2.5 MG tablet Take 2.5 mg by mouth daily.    . memantine (NAMENDA) 10 MG tablet Take 10 mg by mouth.    . metFORMIN (GLUCOPHAGE) 500 MG tablet Take 500 mg by mouth 2 (two) times daily with a meal.    . Multiple Vitamin (MULTIVITAMIN) tablet Take 1 tablet by mouth daily.    . Omega-3 Fatty Acids (FISH OIL PO) Take 1 capsule by mouth 2 (two) times daily.    Marland Kitchen topiramate (TOPAMAX) 25 MG tablet      No current facility-administered medications on file prior to visit.     Allergies  Allergen  Reactions  . Tylenol [Acetaminophen] Other (See Comments)    Makes him "hyper"    Objective:  General: Alert and oriented x3 in no acute distress  Dermatology: Sutures intact plantar right 3rd toe, No open lesions bilateral lower extremities, no webspace macerations, + right forefoot swelling, no ecchymosis bilateral, all nails x 10 are well manicured.  Vascular: Dorsalis Pedis and Posterior Tibial pedal pulses palpable 1/4 bilateral, Capillary Fill Time 3 seconds,no pedal hair growth bilateral, Temperature gradient within normal limits.  Neurology: Johney Maine sensation intact via light touch bilateral, Protective sensation diminishedwith Semmes Weinstein Monofilament to all pedal sites, Position sense intact, vibratory diminished bilateral. (- )Tinels sign bilateral.   Musculoskeletal: Very minimal tenderness with palpation at 3rd toe on right foot. Right limb length discrepancy with the right leg been significantly shorter than the left.  Xrays right foot reveal almost completely healed nondisplaced fracture at proximal phalanx extending into the PIPJ at 3rd toe.   Assessment and Plan: Problem List Items Addressed This Visit    None    Visit Diagnoses    Closed fracture of phalanx of right third toe with routine healing, subsequent encounter    -  Primary   Relevant Orders   DG  Foot Complete Right   Toe pain, right       Diabetic polyneuropathy associated with type 2 diabetes mellitus ([place])          -Complete examination performed -Xrays reviewed -Discussed  continued plan of care for prematurely healed fracture, right third toe -May discontinue buddy taping the toes -Sutures removed and a dry sterile sock was applied. Patient may shower as normal. -Continue with normal custom shoe and physical therapy -Patient to return to office in 4 weeks for final xray or sooner if condition worsens.  Landis Martins, DPM

## 2016-12-12 DIAGNOSIS — E785 Hyperlipidemia, unspecified: Secondary | ICD-10-CM | POA: Diagnosis not present

## 2016-12-12 DIAGNOSIS — I10 Essential (primary) hypertension: Secondary | ICD-10-CM | POA: Diagnosis not present

## 2016-12-12 DIAGNOSIS — Z794 Long term (current) use of insulin: Secondary | ICD-10-CM | POA: Diagnosis not present

## 2016-12-12 DIAGNOSIS — R001 Bradycardia, unspecified: Secondary | ICD-10-CM | POA: Diagnosis not present

## 2016-12-12 DIAGNOSIS — E119 Type 2 diabetes mellitus without complications: Secondary | ICD-10-CM | POA: Diagnosis not present

## 2016-12-12 DIAGNOSIS — Z45018 Encounter for adjustment and management of other part of cardiac pacemaker: Secondary | ICD-10-CM | POA: Diagnosis not present

## 2016-12-29 ENCOUNTER — Ambulatory Visit (INDEPENDENT_AMBULATORY_CARE_PROVIDER_SITE_OTHER): Payer: PPO | Admitting: Sports Medicine

## 2016-12-29 ENCOUNTER — Ambulatory Visit (INDEPENDENT_AMBULATORY_CARE_PROVIDER_SITE_OTHER): Payer: PPO

## 2016-12-29 DIAGNOSIS — E1142 Type 2 diabetes mellitus with diabetic polyneuropathy: Secondary | ICD-10-CM | POA: Diagnosis not present

## 2016-12-29 DIAGNOSIS — S92501D Displaced unspecified fracture of right lesser toe(s), subsequent encounter for fracture with routine healing: Secondary | ICD-10-CM | POA: Diagnosis not present

## 2016-12-29 DIAGNOSIS — M79674 Pain in right toe(s): Secondary | ICD-10-CM | POA: Diagnosis not present

## 2016-12-29 NOTE — Progress Notes (Signed)
Subjective: Victor Murphy is a 83 y.o. male patient who returns to office for follow-up evaluation of Right toe pain secondary to fracture injury with skin laceration. Patient reports that it feels good, no issues, stopped physical therapy because it was "wearing him out". Patient denies any other pedal complaints  FBS in the 200 range, unchanged from prior  Patient Active Problem List   Diagnosis Date Noted  . Chronic daily headache 04/28/2013  . Cervical dystonia 04/28/2013    Current Outpatient Prescriptions on File Prior to Visit  Medication Sig Dispense Refill  . allopurinol (ZYLOPRIM) 300 MG tablet     . ALPRAZolam (XANAX) 1 MG tablet     . aspirin 81 MG tablet Take 81 mg by mouth daily.    . Calcium-Magnesium-Vitamin D (CALCIUM 500 PO) Take 1 tablet by mouth daily.    . Cholecalciferol (VITAMIN D PO) Take 1 tablet by mouth daily.    . Coenzyme Q10 (CO Q 10 PO) Take 1 tablet by mouth daily.    . Cyanocobalamin (VITAMIN B 12 PO) Take 1 tablet by mouth daily.    . divalproex (DEPAKOTE) 250 MG DR tablet Take 250 mg by mouth.    . donepezil (ARICEPT) 10 MG tablet Take 10 mg by mouth.    Marland Kitchen glimepiride (AMARYL) 4 MG tablet Take 4 mg by mouth.    . Insulin Glargine (BASAGLAR KWIKPEN) 100 UNIT/ML SOPN Inject into the skin.    Marland Kitchen levothyroxine (SYNTHROID, LEVOTHROID) 25 MCG tablet Take by mouth.    Marland Kitchen lisinopril (PRINIVIL,ZESTRIL) 2.5 MG tablet Take 2.5 mg by mouth daily.    . memantine (NAMENDA) 10 MG tablet Take 10 mg by mouth.    . metFORMIN (GLUCOPHAGE) 500 MG tablet Take 500 mg by mouth 2 (two) times daily with a meal.    . Multiple Vitamin (MULTIVITAMIN) tablet Take 1 tablet by mouth daily.    . Omega-3 Fatty Acids (FISH OIL PO) Take 1 capsule by mouth 2 (two) times daily.    Marland Kitchen topiramate (TOPAMAX) 25 MG tablet      No current facility-administered medications on file prior to visit.     Allergies  Allergen Reactions  . Tylenol [Acetaminophen] Other (See Comments)    Makes him  "hyper"    Objective:  General: Alert and oriented x3 in no acute distress  Dermatology: Laceration well healed at right 3rd toe, No open lesions bilateral lower extremities, no webspace macerations, Resolved right forefoot swelling, no ecchymosis bilateral, all nails x 10 are well manicured.  Vascular: Dorsalis Pedis and Posterior Tibial pedal pulses palpable 1/4 bilateral, Capillary Fill Time 3 seconds,no pedal hair growth bilateral, Temperature gradient within normal limits.  Neurology: Johney Maine sensation intact via light touch bilateral, Protective sensation diminishedwith Semmes Weinstein Monofilament to all pedal sites, Position sense intact, vibratory diminished bilateral. (- )Tinels sign bilateral.   Musculoskeletal: No tenderness with palpation at 3rd toe on right foot. Right limb length discrepancy with the right leg been significantly shorter than the left.  Xrays right foot reveals completely healed fracture at proximal phalanx at 3rd toe.   Assessment and Plan: Problem List Items Addressed This Visit    None    Visit Diagnoses    Closed fracture of phalanx of right third toe with routine healing, subsequent encounter    -  Primary   healed   Relevant Orders   DG Foot Complete Right   Toe pain, right       Diabetic polyneuropathy associated with type  2 diabetes mellitus ([place])          -Complete examination performed -Xrays reviewed -Fracture at 3rd toe is healed -Continue with normal custom shoe on right -Continue with walker for stability in gait and to rest when ambulating to prevent from feeling tired -Patient wants to continue to have family and PCP to provide diabetic foot care -Patient to return to office as needed   Landis Martins, DPM

## 2017-01-11 DIAGNOSIS — E1165 Type 2 diabetes mellitus with hyperglycemia: Secondary | ICD-10-CM | POA: Diagnosis not present

## 2017-01-11 DIAGNOSIS — E1129 Type 2 diabetes mellitus with other diabetic kidney complication: Secondary | ICD-10-CM | POA: Diagnosis not present

## 2017-01-19 DIAGNOSIS — N183 Chronic kidney disease, stage 3 (moderate): Secondary | ICD-10-CM | POA: Diagnosis not present

## 2017-01-19 DIAGNOSIS — D631 Anemia in chronic kidney disease: Secondary | ICD-10-CM | POA: Diagnosis not present

## 2017-01-19 DIAGNOSIS — N2581 Secondary hyperparathyroidism of renal origin: Secondary | ICD-10-CM | POA: Diagnosis not present

## 2017-01-19 DIAGNOSIS — E669 Obesity, unspecified: Secondary | ICD-10-CM | POA: Diagnosis not present

## 2017-01-24 DIAGNOSIS — M8580 Other specified disorders of bone density and structure, unspecified site: Secondary | ICD-10-CM | POA: Diagnosis not present

## 2017-01-24 DIAGNOSIS — Z9889 Other specified postprocedural states: Secondary | ICD-10-CM | POA: Diagnosis not present

## 2017-01-24 DIAGNOSIS — Z967 Presence of other bone and tendon implants: Secondary | ICD-10-CM | POA: Diagnosis not present

## 2017-01-24 DIAGNOSIS — M1711 Unilateral primary osteoarthritis, right knee: Secondary | ICD-10-CM | POA: Diagnosis not present

## 2017-01-24 DIAGNOSIS — Z4789 Encounter for other orthopedic aftercare: Secondary | ICD-10-CM | POA: Diagnosis not present

## 2017-01-24 DIAGNOSIS — Z8781 Personal history of (healed) traumatic fracture: Secondary | ICD-10-CM | POA: Diagnosis not present

## 2017-01-25 DIAGNOSIS — E1129 Type 2 diabetes mellitus with other diabetic kidney complication: Secondary | ICD-10-CM | POA: Diagnosis not present

## 2017-01-25 DIAGNOSIS — E1165 Type 2 diabetes mellitus with hyperglycemia: Secondary | ICD-10-CM | POA: Diagnosis not present

## 2017-01-30 DIAGNOSIS — E782 Mixed hyperlipidemia: Secondary | ICD-10-CM | POA: Diagnosis not present

## 2017-01-30 DIAGNOSIS — Z79899 Other long term (current) drug therapy: Secondary | ICD-10-CM | POA: Diagnosis not present

## 2017-01-30 DIAGNOSIS — E1140 Type 2 diabetes mellitus with diabetic neuropathy, unspecified: Secondary | ICD-10-CM | POA: Diagnosis not present

## 2017-02-08 DIAGNOSIS — E1140 Type 2 diabetes mellitus with diabetic neuropathy, unspecified: Secondary | ICD-10-CM | POA: Diagnosis not present

## 2017-02-08 DIAGNOSIS — W57XXXA Bitten or stung by nonvenomous insect and other nonvenomous arthropods, initial encounter: Secondary | ICD-10-CM | POA: Diagnosis not present

## 2017-02-08 DIAGNOSIS — E1165 Type 2 diabetes mellitus with hyperglycemia: Secondary | ICD-10-CM | POA: Diagnosis not present

## 2017-02-08 DIAGNOSIS — Z6836 Body mass index (BMI) 36.0-36.9, adult: Secondary | ICD-10-CM | POA: Diagnosis not present

## 2017-02-22 ENCOUNTER — Ambulatory Visit: Payer: Self-pay | Admitting: Cardiology

## 2017-02-22 ENCOUNTER — Ambulatory Visit (INDEPENDENT_AMBULATORY_CARE_PROVIDER_SITE_OTHER): Payer: Self-pay | Admitting: Cardiology

## 2017-02-22 ENCOUNTER — Encounter: Payer: Self-pay | Admitting: Cardiology

## 2017-02-22 VITALS — BP 110/60 | HR 88 | Resp 10 | Ht 67.0 in | Wt 212.0 lb

## 2017-02-22 DIAGNOSIS — Z45018 Encounter for adjustment and management of other part of cardiac pacemaker: Secondary | ICD-10-CM

## 2017-02-22 DIAGNOSIS — E785 Hyperlipidemia, unspecified: Secondary | ICD-10-CM

## 2017-02-22 DIAGNOSIS — E119 Type 2 diabetes mellitus without complications: Secondary | ICD-10-CM

## 2017-02-22 DIAGNOSIS — I10 Essential (primary) hypertension: Secondary | ICD-10-CM

## 2017-02-22 DIAGNOSIS — R001 Bradycardia, unspecified: Secondary | ICD-10-CM

## 2017-02-22 MED ORDER — ATORVASTATIN CALCIUM 10 MG PO TABS: 10.0000 mg | ORAL_TABLET | Freq: Every day | ORAL | 3 refills | Status: DC

## 2017-02-22 NOTE — Progress Notes (Signed)
Cardiology Office Note:    Date:  02/22/2017   ID:  Tamarick Kovalcik, DOB Mar 22, 1933, MRN [id number]  PCP:  Myer Peer, MD  Cardiologist:  Jenne Campus, MD    Referring MD: Myer Peer, MD   Chief Complaint  Patient presents with  . Follow-up  I'm doing fine  History of Present Illness:    Victor Murphy is a 83 y.o. male  with pacemaker, diabetes, dyslipidemia. He presents to our office for regular follow-up. He does have some degree of dementia and his wife talks for him most of the time. He denies having any problems no shortness of breath chest pain tightness squeezing pressure burning chest. He does not do much. He said he spent his time sitting in the chair. He used to swim a lot but doesn't do it anymore. Recently he was fine to have very uncontrolled diabetes that is being adjusted. He's not cholesterol medication and according to old guidelines he needs to be taking high intensity statin. I will give him Lipitor 10 mg daily.  Pacemaker be interrogated. Normal functional battery status is good she gets 8 years left and device.  Past Medical History:  Diagnosis Date  . Coronary artery disease   . Diabetes mellitus without complication ([place])   . Head injury     Past Surgical History:  Procedure Laterality Date  . APPENDECTOMY    . broken right leg    . INSERT / REPLACE / REMOVE PACEMAKER     [employer]  . KNEE ARTHROSCOPY    . PACEMAKER INSERTION      Current Medications: Current Meds  Medication Sig  . aspirin 81 MG tablet Take 81 mg by mouth daily.  Marland Kitchen donepezil (ARICEPT) 10 MG tablet Take 10 mg by mouth.  Marland Kitchen glimepiride (AMARYL) 4 MG tablet Take 4 mg by mouth.  . Insulin Glargine (BASAGLAR KWIKPEN) 100 UNIT/ML SOPN Inject 20 Units into the skin.   Marland Kitchen levothyroxine (SYNTHROID, LEVOTHROID) 25 MCG tablet Take by mouth.  . memantine (NAMENDA) 10 MG tablet Take 10 mg by mouth 2 (two) times daily.      Allergies:   Tylenol [acetaminophen]   Social History    Social History  . Marital status: Married    Spouse name: [name]   . Number of children: 0  . Years of education: 12   Occupational History  . disabled    . retired     Social History Main Topics  . Smoking status: Former Research scientist (life sciences)  . Smokeless tobacco: Never Used  . Alcohol use No  . Drug use: No  . Sexual activity: Not Asked   Other Topics Concern  . None   Social History Narrative   Patient lives at home with wife [name].    Patient is retired and disabled.    Patient has no children.    Patient has a 12 grade education.            Family History: The patient's family history is not on file. ROS:   Please see the history of present illness.    All 14 point review of systems negative except as described per history of present illness  EKGs/Labs/Other Studies Reviewed:      Recent Labs: No results found for requested labs within last 8760 hours.  Recent Lipid Panel No results found for: CHOL, TRIG, HDL, CHOLHDL, VLDL, LDLCALC, LDLDIRECT  Physical Exam:    VS:  BP 110/60   Pulse 88   Resp 10  Ht 5\' 7"  (1.702 m)   Wt 212 lb (96.2 kg) Comment: reported, wheel chair  BMI 33.20 kg/m     Wt Readings from Last 3 Encounters:  02/22/17 212 lb (96.2 kg)  04/28/13 209 lb 4 oz (94.9 kg)     GEN:  Well nourished, well developed in no acute distress HEENT: Normal NECK: No JVD; No carotid bruits LYMPHATICS: No lymphadenopathy CARDIAC: RRR, no murmurs, no rubs, no gallops RESPIRATORY:  Clear to auscultation without rales, wheezing or rhonchi  ABDOMEN: Soft, non-tender, non-distended MUSCULOSKELETAL:  No edema; No deformity  SKIN: Warm and dry LOWER EXTREMITIES: no swelling NEUROLOGIC:  Alert and oriented x 3 PSYCHIATRIC:  Normal affect   ASSESSMENT:    1. Essential hypertension   2. Type 2 diabetes mellitus without complication, without long-term current use of insulin ([place])   3. Bradycardia   4. Dyslipidemia (high LDL; low HDL)   5. Pacemaker  reprogramming/check    PLAN:    In order of problems listed above:  1. Essential hypertension: Blood pressure appears to be controlled we'll continue present management. 2. Type 2 diabetes: The better from that point review previously his diabetes is very poorly controlled. 3. Bradycardia: This being addressed with pacemaker. 4. Dyslipidemia: I he finally agreed to take statin. We'll give him 10 mg of Lipitor. I talked to him that ideally he need to be taking high intensity statin he's reluctant to do that. 5. Pacemaker: We'll check device today.   Medication Adjustments/Labs and Tests Ordered: Current medicines are reviewed at length with the patient today.  Concerns regarding medicines are outlined above.  No orders of the defined types were placed in this encounter.  Medication changes: No orders of the defined types were placed in this encounter.   Signed, Park Liter, MD, [facility] 02/22/2017 11:46 AM    [place]

## 2017-02-22 NOTE — Patient Instructions (Addendum)
Medication Instructions:  Your physician has recommended you make the following change in your medication:  1.) START Lipitor (atorovastatin) 10 mg daily.   Labwork: None   Testing/Procedures: Device interrogation today in office.   Follow-Up: Your physician wants you to follow-up in: 1 Year You will receive a reminder letter in the mail two months in advance. If you don't receive a letter, please call our office to schedule the follow-up appointment.  Any Other Special Instructions Will Be Listed Below (If Applicable).  Please note that any paperwork needing to be filled out by the provider will need to be addressed at the front desk prior to seeing the provider. Please note that any paperwork FMLA, Disability or other documents regarding health condition is subject to a $25.00 charge that must be received prior to completion of paperwork.    If you need a refill on your cardiac medications before your next appointment, please call your pharmacy.

## 2017-03-01 DIAGNOSIS — N183 Chronic kidney disease, stage 3 (moderate): Secondary | ICD-10-CM | POA: Diagnosis not present

## 2017-03-16 DIAGNOSIS — A849 Tick-borne viral encephalitis, unspecified: Secondary | ICD-10-CM | POA: Diagnosis not present

## 2017-03-16 DIAGNOSIS — A932 Colorado tick fever: Secondary | ICD-10-CM | POA: Diagnosis not present

## 2017-03-24 DIAGNOSIS — M62838 Other muscle spasm: Secondary | ICD-10-CM | POA: Diagnosis not present

## 2017-03-24 DIAGNOSIS — M542 Cervicalgia: Secondary | ICD-10-CM | POA: Diagnosis not present

## 2017-03-24 DIAGNOSIS — M62830 Muscle spasm of back: Secondary | ICD-10-CM | POA: Diagnosis not present

## 2017-03-26 DIAGNOSIS — M9901 Segmental and somatic dysfunction of cervical region: Secondary | ICD-10-CM | POA: Diagnosis not present

## 2017-03-26 DIAGNOSIS — M542 Cervicalgia: Secondary | ICD-10-CM | POA: Diagnosis not present

## 2017-03-26 DIAGNOSIS — M9902 Segmental and somatic dysfunction of thoracic region: Secondary | ICD-10-CM | POA: Diagnosis not present

## 2017-05-16 DIAGNOSIS — Z1331 Encounter for screening for depression: Secondary | ICD-10-CM | POA: Diagnosis not present

## 2017-05-16 DIAGNOSIS — E1140 Type 2 diabetes mellitus with diabetic neuropathy, unspecified: Secondary | ICD-10-CM | POA: Diagnosis not present

## 2017-05-16 DIAGNOSIS — Z0000 Encounter for general adult medical examination without abnormal findings: Secondary | ICD-10-CM | POA: Diagnosis not present

## 2017-05-16 DIAGNOSIS — Z79899 Other long term (current) drug therapy: Secondary | ICD-10-CM | POA: Diagnosis not present

## 2017-05-16 DIAGNOSIS — R630 Anorexia: Secondary | ICD-10-CM | POA: Diagnosis not present

## 2017-05-16 DIAGNOSIS — Z1339 Encounter for screening examination for other mental health and behavioral disorders: Secondary | ICD-10-CM | POA: Diagnosis not present

## 2017-05-16 DIAGNOSIS — Z6836 Body mass index (BMI) 36.0-36.9, adult: Secondary | ICD-10-CM | POA: Diagnosis not present

## 2017-05-16 DIAGNOSIS — Z23 Encounter for immunization: Secondary | ICD-10-CM | POA: Diagnosis not present

## 2017-05-16 DIAGNOSIS — E1165 Type 2 diabetes mellitus with hyperglycemia: Secondary | ICD-10-CM | POA: Diagnosis not present

## 2017-05-16 DIAGNOSIS — E039 Hypothyroidism, unspecified: Secondary | ICD-10-CM | POA: Diagnosis not present

## 2017-07-12 DIAGNOSIS — Z950 Presence of cardiac pacemaker: Secondary | ICD-10-CM | POA: Diagnosis not present

## 2017-09-06 DIAGNOSIS — N3000 Acute cystitis without hematuria: Secondary | ICD-10-CM | POA: Diagnosis not present

## 2017-09-06 DIAGNOSIS — N3090 Cystitis, unspecified without hematuria: Secondary | ICD-10-CM | POA: Diagnosis not present

## 2017-09-12 DIAGNOSIS — G4700 Insomnia, unspecified: Secondary | ICD-10-CM | POA: Diagnosis not present

## 2017-09-12 DIAGNOSIS — R300 Dysuria: Secondary | ICD-10-CM | POA: Diagnosis not present

## 2017-09-12 DIAGNOSIS — N419 Inflammatory disease of prostate, unspecified: Secondary | ICD-10-CM | POA: Diagnosis not present

## 2017-10-18 DIAGNOSIS — Z950 Presence of cardiac pacemaker: Secondary | ICD-10-CM | POA: Diagnosis not present

## 2017-11-01 DIAGNOSIS — S20219A Contusion of unspecified front wall of thorax, initial encounter: Secondary | ICD-10-CM | POA: Diagnosis not present

## 2017-11-01 DIAGNOSIS — S40012A Contusion of left shoulder, initial encounter: Secondary | ICD-10-CM | POA: Diagnosis not present

## 2017-11-01 DIAGNOSIS — E039 Hypothyroidism, unspecified: Secondary | ICD-10-CM | POA: Diagnosis not present

## 2017-11-01 DIAGNOSIS — E1165 Type 2 diabetes mellitus with hyperglycemia: Secondary | ICD-10-CM | POA: Diagnosis not present

## 2017-11-01 DIAGNOSIS — S59902A Unspecified injury of left elbow, initial encounter: Secondary | ICD-10-CM | POA: Diagnosis not present

## 2017-11-01 DIAGNOSIS — Z79899 Other long term (current) drug therapy: Secondary | ICD-10-CM | POA: Diagnosis not present

## 2017-11-01 DIAGNOSIS — Z950 Presence of cardiac pacemaker: Secondary | ICD-10-CM | POA: Diagnosis not present

## 2017-11-01 DIAGNOSIS — S5000XA Contusion of unspecified elbow, initial encounter: Secondary | ICD-10-CM | POA: Diagnosis not present

## 2017-11-01 DIAGNOSIS — S40019A Contusion of unspecified shoulder, initial encounter: Secondary | ICD-10-CM | POA: Diagnosis not present

## 2017-11-01 DIAGNOSIS — S40022A Contusion of left upper arm, initial encounter: Secondary | ICD-10-CM | POA: Diagnosis not present

## 2017-11-08 DIAGNOSIS — E1165 Type 2 diabetes mellitus with hyperglycemia: Secondary | ICD-10-CM | POA: Diagnosis not present

## 2017-11-08 DIAGNOSIS — Z6837 Body mass index (BMI) 37.0-37.9, adult: Secondary | ICD-10-CM | POA: Diagnosis not present

## 2017-11-08 DIAGNOSIS — N400 Enlarged prostate without lower urinary tract symptoms: Secondary | ICD-10-CM | POA: Diagnosis not present

## 2017-11-08 DIAGNOSIS — E039 Hypothyroidism, unspecified: Secondary | ICD-10-CM | POA: Diagnosis not present

## 2017-12-06 DIAGNOSIS — Z950 Presence of cardiac pacemaker: Secondary | ICD-10-CM | POA: Diagnosis not present

## 2017-12-06 DIAGNOSIS — R001 Bradycardia, unspecified: Secondary | ICD-10-CM | POA: Diagnosis not present

## 2017-12-06 DIAGNOSIS — Z45018 Encounter for adjustment and management of other part of cardiac pacemaker: Secondary | ICD-10-CM | POA: Diagnosis not present

## 2018-01-05 IMAGING — US US RENAL
1 series · 14 of 25 positions shown · non-contrast
Comparison: None

CLINICAL DATA: Stage III chronic kidney disease, diabetes mellitus,
coronary artery disease, former smoker

EXAM:
RENAL / URINARY TRACT ULTRASOUND COMPLETE

[Series 1: us renal · 0.20mm/px · 14 of 44 slices shown]
[im 1/44]
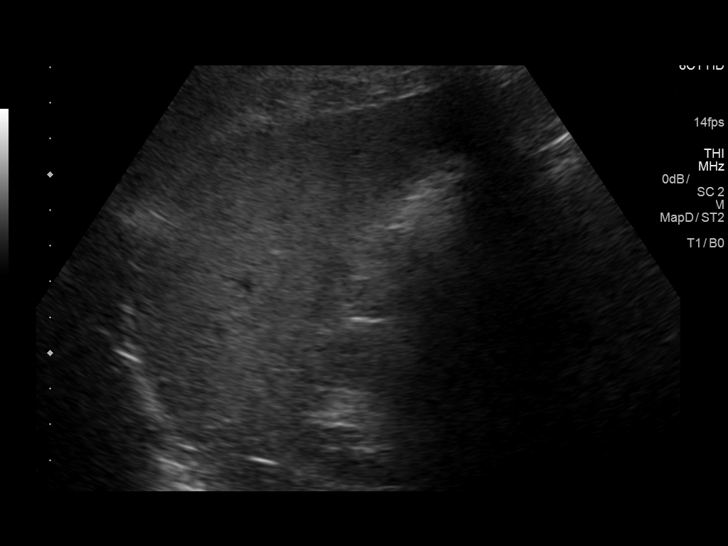
[im 4/44]
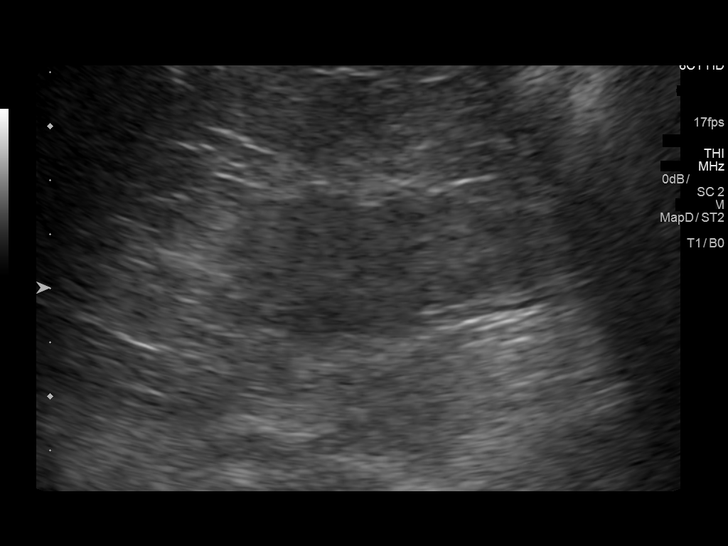
[im 8/44]
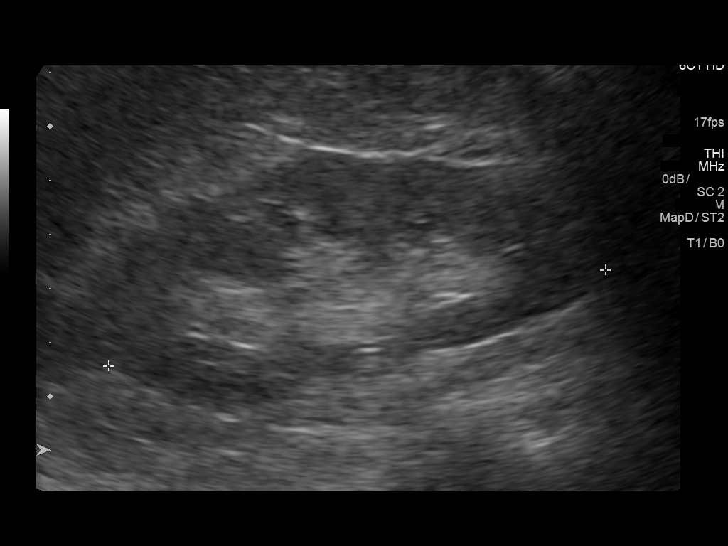
[im 11/44]
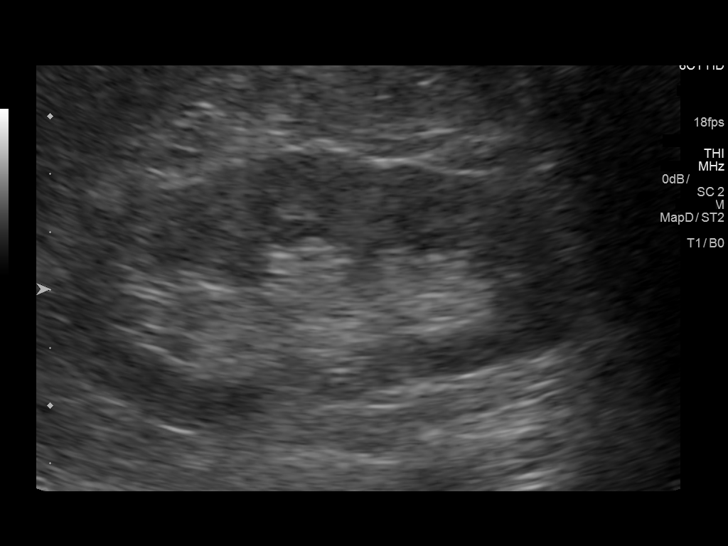
[im 15/44]
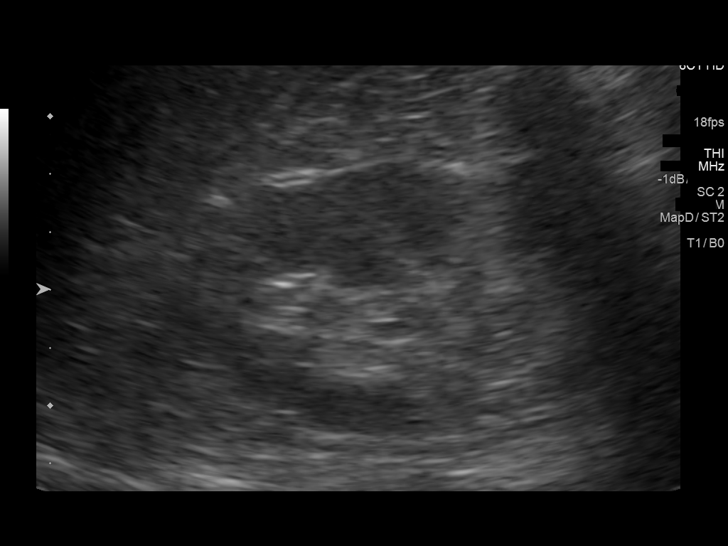
[im 17/44]
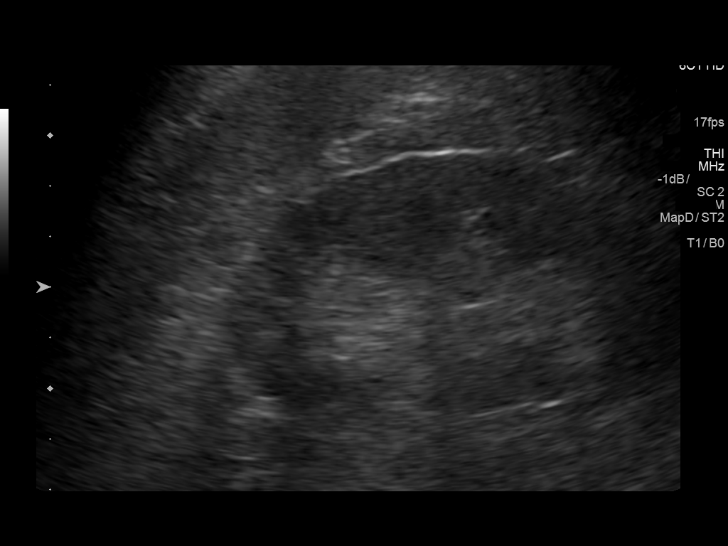
[im 20/44]
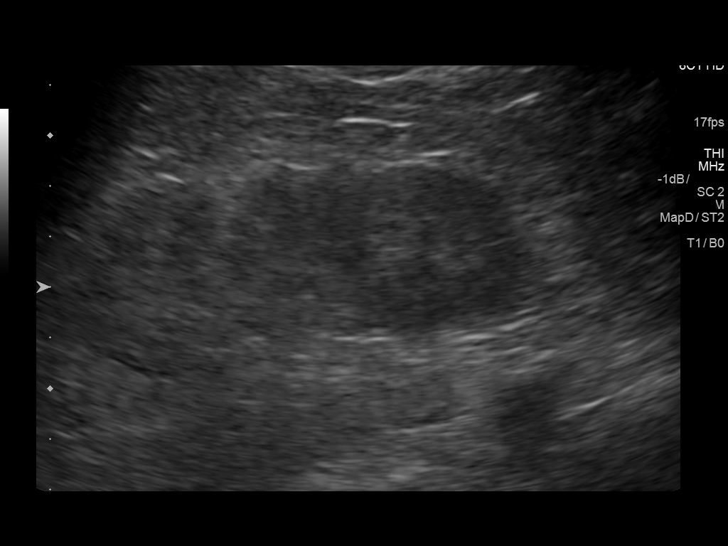
[im 24/44]
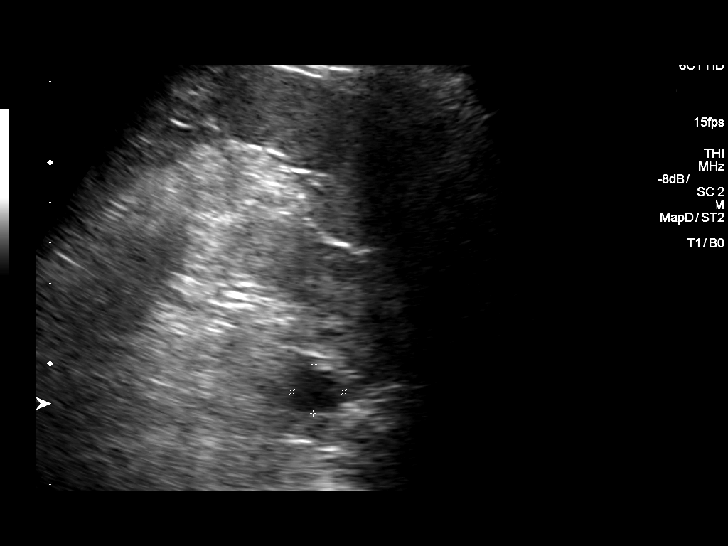
[im 27/44]
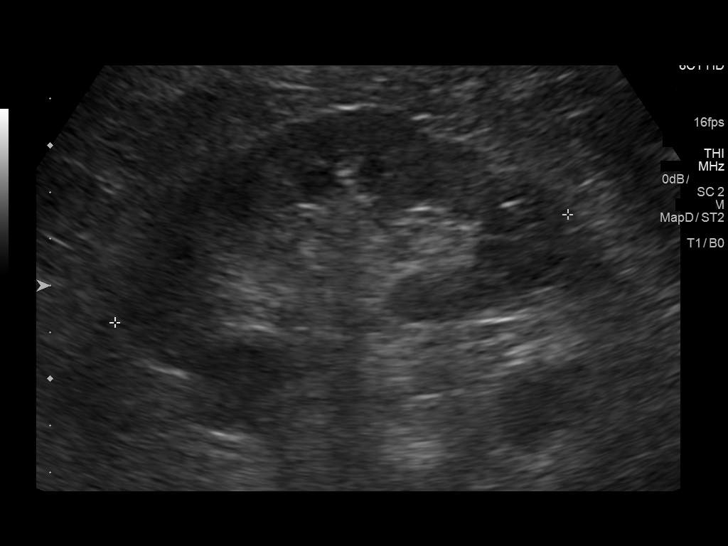
[im 29/44]
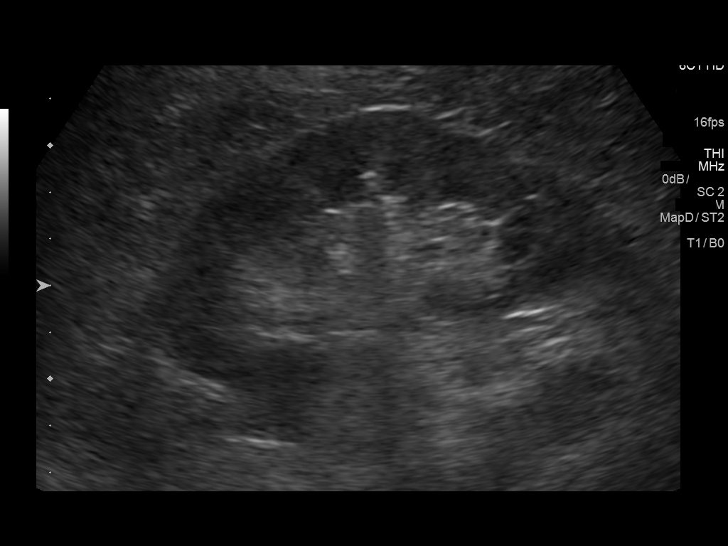
[im 33/44]
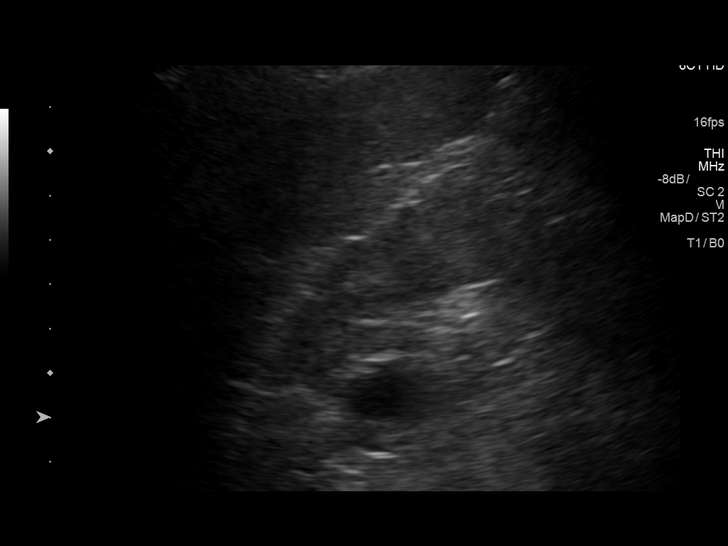
[im 36/44]
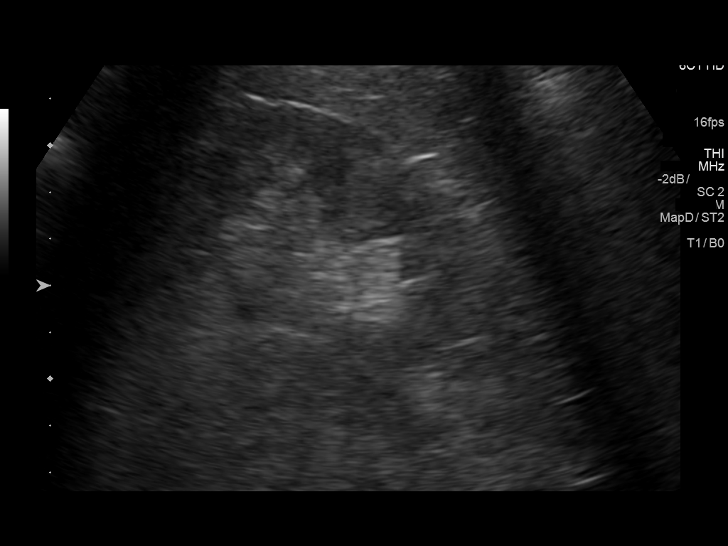
[im 40/44]
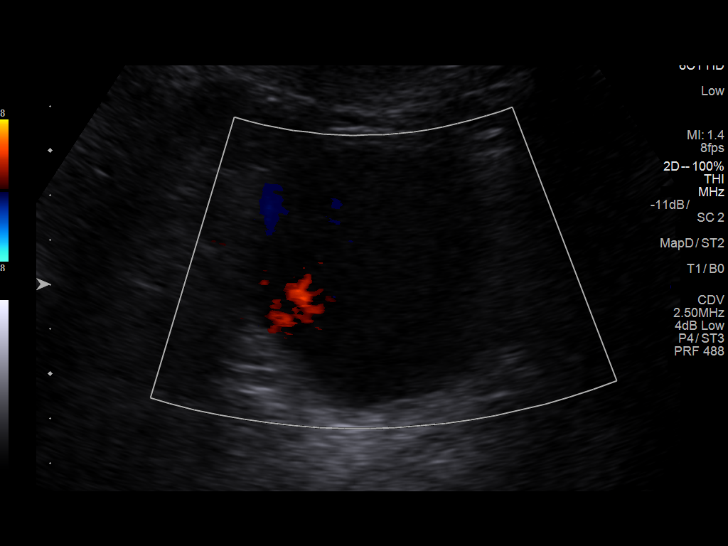
[im 44/44]
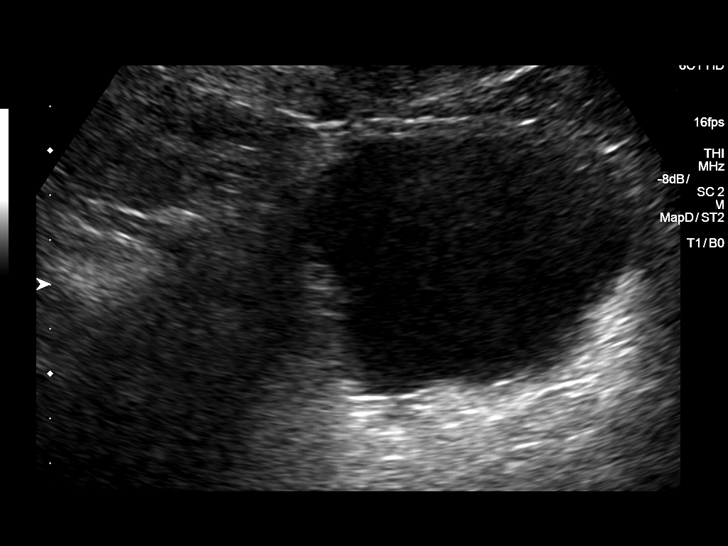

[14 of 25 positions shown; findings below may reference images not displayed]

FINDINGS: Right Kidney:

Length: 9.3 cm. Normal cortical thickness. Borderline increased
cortical echogenicity. No mass, hydronephrosis or shadowing
calcification.

Left Kidney:

Length: 10.1 cm. Upper normal cortical echogenicity. Normal cortical
thickness. Small cyst at upper pole 16 x 12 x 13 mm. No
hydronephrosis or definite shadowing calcification.

Bladder:

Appears normal for degree of bladder distention.
IMPRESSION: Small LEFT renal cyst.

Otherwise negative exam.

## 2018-01-23 DIAGNOSIS — Z23 Encounter for immunization: Secondary | ICD-10-CM | POA: Diagnosis not present

## 2018-01-23 DIAGNOSIS — J029 Acute pharyngitis, unspecified: Secondary | ICD-10-CM | POA: Diagnosis not present

## 2018-01-23 DIAGNOSIS — Z6835 Body mass index (BMI) 35.0-35.9, adult: Secondary | ICD-10-CM | POA: Diagnosis not present

## 2018-02-07 DIAGNOSIS — E1140 Type 2 diabetes mellitus with diabetic neuropathy, unspecified: Secondary | ICD-10-CM | POA: Diagnosis not present

## 2018-02-07 DIAGNOSIS — E1165 Type 2 diabetes mellitus with hyperglycemia: Secondary | ICD-10-CM | POA: Diagnosis not present

## 2018-02-07 DIAGNOSIS — Z9181 History of falling: Secondary | ICD-10-CM | POA: Diagnosis not present

## 2018-02-07 DIAGNOSIS — R809 Proteinuria, unspecified: Secondary | ICD-10-CM | POA: Diagnosis not present

## 2018-02-07 DIAGNOSIS — Z794 Long term (current) use of insulin: Secondary | ICD-10-CM | POA: Diagnosis not present

## 2018-02-07 DIAGNOSIS — E1129 Type 2 diabetes mellitus with other diabetic kidney complication: Secondary | ICD-10-CM | POA: Diagnosis not present

## 2018-02-07 DIAGNOSIS — Z6835 Body mass index (BMI) 35.0-35.9, adult: Secondary | ICD-10-CM | POA: Diagnosis not present

## 2018-02-13 DIAGNOSIS — R809 Proteinuria, unspecified: Secondary | ICD-10-CM | POA: Diagnosis not present

## 2018-02-13 DIAGNOSIS — E1129 Type 2 diabetes mellitus with other diabetic kidney complication: Secondary | ICD-10-CM | POA: Diagnosis not present

## 2018-02-19 DIAGNOSIS — R809 Proteinuria, unspecified: Secondary | ICD-10-CM | POA: Diagnosis not present

## 2018-02-19 DIAGNOSIS — Z794 Long term (current) use of insulin: Secondary | ICD-10-CM | POA: Diagnosis not present

## 2018-02-19 DIAGNOSIS — E1129 Type 2 diabetes mellitus with other diabetic kidney complication: Secondary | ICD-10-CM | POA: Diagnosis not present

## 2018-03-14 DIAGNOSIS — I10 Essential (primary) hypertension: Secondary | ICD-10-CM | POA: Diagnosis not present

## 2018-03-14 DIAGNOSIS — E782 Mixed hyperlipidemia: Secondary | ICD-10-CM | POA: Diagnosis not present

## 2018-03-14 DIAGNOSIS — E1129 Type 2 diabetes mellitus with other diabetic kidney complication: Secondary | ICD-10-CM | POA: Diagnosis not present

## 2018-04-09 DIAGNOSIS — E782 Mixed hyperlipidemia: Secondary | ICD-10-CM | POA: Diagnosis not present

## 2018-05-13 DIAGNOSIS — E119 Type 2 diabetes mellitus without complications: Secondary | ICD-10-CM | POA: Diagnosis not present

## 2018-05-16 DIAGNOSIS — E1129 Type 2 diabetes mellitus with other diabetic kidney complication: Secondary | ICD-10-CM | POA: Diagnosis not present

## 2018-05-16 DIAGNOSIS — E039 Hypothyroidism, unspecified: Secondary | ICD-10-CM | POA: Diagnosis not present

## 2018-05-16 DIAGNOSIS — I10 Essential (primary) hypertension: Secondary | ICD-10-CM | POA: Diagnosis not present

## 2018-05-16 DIAGNOSIS — E782 Mixed hyperlipidemia: Secondary | ICD-10-CM | POA: Diagnosis not present

## 2018-05-16 DIAGNOSIS — Z79899 Other long term (current) drug therapy: Secondary | ICD-10-CM | POA: Diagnosis not present

## 2018-05-22 DIAGNOSIS — Z23 Encounter for immunization: Secondary | ICD-10-CM | POA: Diagnosis not present

## 2018-05-22 DIAGNOSIS — Z1331 Encounter for screening for depression: Secondary | ICD-10-CM | POA: Diagnosis not present

## 2018-05-22 DIAGNOSIS — E1129 Type 2 diabetes mellitus with other diabetic kidney complication: Secondary | ICD-10-CM | POA: Diagnosis not present

## 2018-05-22 DIAGNOSIS — Z0000 Encounter for general adult medical examination without abnormal findings: Secondary | ICD-10-CM | POA: Diagnosis not present

## 2018-05-22 DIAGNOSIS — E1165 Type 2 diabetes mellitus with hyperglycemia: Secondary | ICD-10-CM | POA: Diagnosis not present

## 2018-05-22 DIAGNOSIS — Z1339 Encounter for screening examination for other mental health and behavioral disorders: Secondary | ICD-10-CM | POA: Diagnosis not present

## 2018-05-22 DIAGNOSIS — F0280 Dementia in other diseases classified elsewhere without behavioral disturbance: Secondary | ICD-10-CM | POA: Diagnosis not present

## 2018-05-22 DIAGNOSIS — Z6835 Body mass index (BMI) 35.0-35.9, adult: Secondary | ICD-10-CM | POA: Diagnosis not present

## 2018-05-22 DIAGNOSIS — G301 Alzheimer's disease with late onset: Secondary | ICD-10-CM | POA: Diagnosis not present

## 2018-06-04 DIAGNOSIS — E785 Hyperlipidemia, unspecified: Secondary | ICD-10-CM | POA: Diagnosis not present

## 2018-06-04 DIAGNOSIS — N183 Chronic kidney disease, stage 3 (moderate): Secondary | ICD-10-CM | POA: Diagnosis not present

## 2018-06-04 DIAGNOSIS — N400 Enlarged prostate without lower urinary tract symptoms: Secondary | ICD-10-CM | POA: Diagnosis not present

## 2018-06-04 DIAGNOSIS — I129 Hypertensive chronic kidney disease with stage 1 through stage 4 chronic kidney disease, or unspecified chronic kidney disease: Secondary | ICD-10-CM | POA: Diagnosis not present

## 2018-06-04 DIAGNOSIS — N2581 Secondary hyperparathyroidism of renal origin: Secondary | ICD-10-CM | POA: Diagnosis not present

## 2018-06-04 DIAGNOSIS — E1122 Type 2 diabetes mellitus with diabetic chronic kidney disease: Secondary | ICD-10-CM | POA: Diagnosis not present

## 2018-06-04 DIAGNOSIS — M109 Gout, unspecified: Secondary | ICD-10-CM | POA: Diagnosis not present

## 2018-06-04 DIAGNOSIS — D631 Anemia in chronic kidney disease: Secondary | ICD-10-CM | POA: Diagnosis not present

## 2018-06-27 ENCOUNTER — Ambulatory Visit: Payer: Self-pay | Admitting: Cardiology

## 2018-07-05 DIAGNOSIS — I10 Essential (primary) hypertension: Secondary | ICD-10-CM | POA: Diagnosis not present

## 2018-07-05 DIAGNOSIS — E782 Mixed hyperlipidemia: Secondary | ICD-10-CM | POA: Diagnosis not present

## 2018-07-11 ENCOUNTER — Other Ambulatory Visit: Payer: Self-pay

## 2018-07-11 NOTE — Patient Outreach (Signed)
[facility]) Care Management  07/11/2018  Victor Murphy 23-Mar-1933 [id number]   Referral Date: 07/11/18 Referral Source: Nurseline Referral Reason: right leg pain   Outreach Attempt: spoke with wife. She states that patient is doing better.  She states that she did not take the patient to the ED as pain did go away.  She states however that she will be contacting PCP office on advice for his medications.  CM discussed with spouse leg pain and patient sundowners and encouraged her to seek medical attention for any worsening symptoms. She verbalized understanding.  She denies any further needs at this time.    Plan: RN CM will close case.   Jone Baseman, RN, MSN [facility] Care Management Care Management Coordinator Direct Line [phone] Toll Free: [phone]  Fax: [phone]

## 2018-07-16 DIAGNOSIS — E1165 Type 2 diabetes mellitus with hyperglycemia: Secondary | ICD-10-CM | POA: Diagnosis not present

## 2018-07-16 DIAGNOSIS — E039 Hypothyroidism, unspecified: Secondary | ICD-10-CM | POA: Diagnosis not present

## 2018-07-16 DIAGNOSIS — G301 Alzheimer's disease with late onset: Secondary | ICD-10-CM | POA: Diagnosis not present

## 2018-07-22 ENCOUNTER — Encounter: Payer: Self-pay | Admitting: Cardiology

## 2018-07-22 ENCOUNTER — Ambulatory Visit (INDEPENDENT_AMBULATORY_CARE_PROVIDER_SITE_OTHER): Payer: PPO | Admitting: Cardiology

## 2018-07-22 VITALS — BP 130/76 | HR 80 | Ht 67.0 in | Wt 230.0 lb

## 2018-07-22 DIAGNOSIS — I10 Essential (primary) hypertension: Secondary | ICD-10-CM | POA: Diagnosis not present

## 2018-07-22 DIAGNOSIS — Z950 Presence of cardiac pacemaker: Secondary | ICD-10-CM | POA: Insufficient documentation

## 2018-07-22 DIAGNOSIS — E785 Hyperlipidemia, unspecified: Secondary | ICD-10-CM | POA: Diagnosis not present

## 2018-07-22 DIAGNOSIS — R001 Bradycardia, unspecified: Secondary | ICD-10-CM | POA: Diagnosis not present

## 2018-07-22 DIAGNOSIS — E119 Type 2 diabetes mellitus without complications: Secondary | ICD-10-CM | POA: Diagnosis not present

## 2018-07-22 NOTE — Patient Instructions (Signed)
Medication Instructions:  .isntcur  If you need a refill on your cardiac medications before your next appointment, please call your pharmacy.   Lab work: None.  If you have labs (blood work) drawn today and your tests are completely normal, you will receive your results only by: Marland Kitchen MyChart Message (if you have MyChart) OR . A paper copy in the mail If you have any lab test that is abnormal or we need to change your treatment, we will call you to review the results.  Testing/Procedures: Your physician has requested that you have an echocardiogram. Echocardiography is a painless test that uses sound waves to create images of your heart. It provides your doctor with information about the size and shape of your heart and how well your heart's chambers and valves are working. This procedure takes approximately one hour. There are no restrictions for this procedure.    Follow-Up: At [facility], you and your health needs are our priority.  As part of our continuing mission to provide you with exceptional heart care, we have created designated Provider Care Teams.  These Care Teams include your primary Cardiologist (physician) and Advanced Practice Providers (APPs -  Physician Assistants and Nurse Practitioners) who all work together to provide you with the care you need, when you need it. You will need a follow up appointment in 3 months.  Please call our office 2 months in advance to schedule this appointment.  You may see Jenne Campus, MD or another member of our [place] Provider Team in [place]: Shirlee More, MD . Jyl Heinz, MD  Any Other Special Instructions Will Be Listed Below (If Applicable).  Dr. Agustin Cree has advised you to see Dr. Curt Bears with electrophysiology for pacemaker management.    Echocardiogram An echocardiogram is a procedure that uses painless sound waves (ultrasound) to produce an image of the heart. Images from an echocardiogram can provide important  information about:  Signs of coronary artery disease (CAD).  Aneurysm detection. An aneurysm is a weak or damaged part of an artery wall that bulges out from the normal force of blood pumping through the body.  Heart size and shape. Changes in the size or shape of the heart can be associated with certain conditions, including heart failure, aneurysm, and CAD.  Heart muscle function.  Heart valve function.  Signs of a past heart attack.  Fluid buildup around the heart.  Thickening of the heart muscle.  A tumor or infectious growth around the heart valves. Tell a health care provider about:  Any allergies you have.  All medicines you are taking, including vitamins, herbs, eye drops, creams, and over-the-counter medicines.  Any blood disorders you have.  Any surgeries you have had.  Any medical conditions you have.  Whether you are pregnant or may be pregnant. What are the risks? Generally, this is a safe procedure. However, problems may occur, including:  Allergic reaction to dye (contrast) that may be used during the procedure. What happens before the procedure? No specific preparation is needed. You may eat and drink normally. What happens during the procedure?   An IV tube may be inserted into one of your veins.  You may receive contrast through this tube. A contrast is an injection that improves the quality of the pictures from your heart.  A gel will be applied to your chest.  A wand-like tool (transducer) will be moved over your chest. The gel will help to transmit the sound waves from the transducer.  The  sound waves will harmlessly bounce off of your heart to allow the heart images to be captured in real-time motion. The images will be recorded on a computer. The procedure may vary among health care providers and hospitals. What happens after the procedure?  You may return to your normal, everyday life, including diet, activities, and medicines, unless your  health care provider tells you not to do that. Summary  An echocardiogram is a procedure that uses painless sound waves (ultrasound) to produce an image of the heart.  Images from an echocardiogram can provide important information about the size and shape of your heart, heart muscle function, heart valve function, and fluid buildup around your heart.  You do not need to do anything to prepare before this procedure. You may eat and drink normally.  After the echocardiogram is completed, you may return to your normal, everyday life, unless your health care provider tells you not to do that. This information is not intended to replace advice given to you by your health care provider. Make sure you discuss any questions you have with your health care provider. Document Released: 06/30/2000 Document Revised: 08/05/2016 Document Reviewed: 08/05/2016 Elsevier Interactive Patient Education  2019 [employer].

## 2018-07-22 NOTE — Progress Notes (Signed)
Cardiology Office Note:    Date:  07/22/2018   ID:  [name], DOB [birth year], MRN [id number]  PCP:  Myer Peer, MD  Cardiologist:  Jenne Campus, MD    Referring MD: Myer Peer, MD   No chief complaint on file. Doing well  History of Present Illness:    Victor Murphy is a 85 y.o. male with multiple medical problems.  He does have dementia that progress is getting worse also essential hypertension diabetes pacemaker dyslipidemia.  EKG today showed possibility of atrial fibrillation.  It was a poor quality study done.  He comes today to call us with his wife does not do much she just sits in the chair all day will look into the window.  Also he does have problems sleeping during the night she sleeps majority of time during the day.  Past Medical History:  Diagnosis Date  . Coronary artery disease   . Diabetes mellitus without complication ([place])   . Head injury     Past Surgical History:  Procedure Laterality Date  . APPENDECTOMY    . broken right leg    . INSERT / REPLACE / REMOVE PACEMAKER     Medtronic  . KNEE ARTHROSCOPY    . PACEMAKER INSERTION      Current Medications: Current Meds  Medication Sig  . divalproex (DEPAKOTE) 250 MG DR tablet Take 250 mg by mouth daily.  Marland Kitchen donepezil (ARICEPT) 10 MG tablet Take 10 mg by mouth.  . Insulin Degludec-Liraglutide (XULTOPHY) 100-3.6 UNIT-MG/ML SOPN Inject into the skin.  . memantine (NAMENDA) 10 MG tablet Take 10 mg by mouth 2 (two) times daily.   . rosuvastatin (CRESTOR) 20 MG tablet Take 20 mg by mouth daily.  Marland Kitchen TAMSULOSIN HCL PO Take 20 mg by mouth daily.     Allergies:   Tylenol [acetaminophen]   Social History   Socioeconomic History  . Marital status: Married    Spouse name: [name]   . Number of children: 0  . Years of education: 68  . Highest education level: Not on file  Occupational History  . Occupation: disabled   . Occupation: retired   Scientific laboratory technician  . Financial resource strain: Not on file    . Food insecurity:    Worry: Not on file    Inability: Not on file  . Transportation needs:    Medical: Not on file    Non-medical: Not on file  Tobacco Use  . Smoking status: Former Research scientist (life sciences)  . Smokeless tobacco: Never Used  Substance and Sexual Activity  . Alcohol use: No  . Drug use: No  . Sexual activity: Not on file  Lifestyle  . Physical activity:    Days per week: Not on file    Minutes per session: Not on file  . Stress: Not on file  Relationships  . Social connections:    Talks on phone: Not on file    Gets together: Not on file    Attends religious service: Not on file    Active member of club or organization: Not on file    Attends meetings of clubs or organizations: Not on file    Relationship status: Not on file  Other Topics Concern  . Not on file  Social History Narrative   Patient lives at home with wife [name].    Patient is retired and disabled.    Patient has no children.    Patient has a 12 grade education.  Family History: The patient's family history is not on file. ROS:   Please see the history of present illness.    All 14 point review of systems negative except as described per history of present illness  EKGs/Labs/Other Studies Reviewed:      Recent Labs: No results found for requested labs within last 8760 hours.  Recent Lipid Panel No results found for: CHOL, TRIG, HDL, CHOLHDL, VLDL, LDLCALC, LDLDIRECT  Physical Exam:    VS:  BP 130/76 (BP Location: Right Arm, Patient Position: Sitting, Cuff Size: Normal)   Pulse 80   Ht 5\' 7"  (1.702 m)   Wt 230 lb (104.3 kg)   SpO2 97%   BMI 36.02 kg/m     Wt Readings from Last 3 Encounters:  07/22/18 230 lb (104.3 kg)  02/22/17 212 lb (96.2 kg)  04/28/13 209 lb 4 oz (94.9 kg)     GEN:  Well nourished, well developed in no acute distress HEENT: Normal NECK: No JVD; No carotid bruits LYMPHATICS: No lymphadenopathy CARDIAC: RRR, no murmurs, no rubs, no gallops RESPIRATORY:   Clear to auscultation without rales, wheezing or rhonchi  ABDOMEN: Soft, non-tender, non-distended MUSCULOSKELETAL:  No edema; No deformity  SKIN: Warm and dry LOWER EXTREMITIES: no swelling NEUROLOGIC:  Alert and oriented x 3 PSYCHIATRIC:  Normal affect   ASSESSMENT:    1. Essential hypertension   2. Type 2 diabetes mellitus without complication, without long-term current use of insulin ([place])   3. Dyslipidemia (high LDL; low HDL)   4. Bradycardia   5. Pacemaker    PLAN:    In order of problems listed above:  1. Essential hypertension blood pressure appears to be well controlled continue present management. 2. Type 2 diabetes poorly controlled because of his noncompliance 3. Dyslipidemia will call primary care physician to get his fasting lipid profile 4. Pacemaker will know him in our pacemaker clinic 5. Possibility of atrial fibrillation again we wait for interrogation of his device.  Doing very and happy about possibility of anticoagulation. 6. I will ask him to have echocardiogram to assess left ventricular ejection fraction   Medication Adjustments/Labs and Tests Ordered: Current medicines are reviewed at length with the patient today.  Concerns regarding medicines are outlined above.  No orders of the defined types were placed in this encounter.  Medication changes: No orders of the defined types were placed in this encounter.   Signed, Park Liter, MD, [facility] 07/22/2018 2:13 PM    [place]

## 2018-07-23 DIAGNOSIS — G4700 Insomnia, unspecified: Secondary | ICD-10-CM | POA: Diagnosis not present

## 2018-07-23 DIAGNOSIS — R350 Frequency of micturition: Secondary | ICD-10-CM | POA: Diagnosis not present

## 2018-07-23 DIAGNOSIS — E1129 Type 2 diabetes mellitus with other diabetic kidney complication: Secondary | ICD-10-CM | POA: Diagnosis not present

## 2018-07-23 DIAGNOSIS — R809 Proteinuria, unspecified: Secondary | ICD-10-CM | POA: Diagnosis not present

## 2018-07-23 DIAGNOSIS — Z794 Long term (current) use of insulin: Secondary | ICD-10-CM | POA: Diagnosis not present

## 2018-07-23 DIAGNOSIS — Z6837 Body mass index (BMI) 37.0-37.9, adult: Secondary | ICD-10-CM | POA: Diagnosis not present

## 2018-07-25 ENCOUNTER — Other Ambulatory Visit: Payer: Self-pay

## 2018-08-15 DIAGNOSIS — E1165 Type 2 diabetes mellitus with hyperglycemia: Secondary | ICD-10-CM | POA: Diagnosis not present

## 2018-08-15 DIAGNOSIS — I10 Essential (primary) hypertension: Secondary | ICD-10-CM | POA: Diagnosis not present

## 2018-08-15 DIAGNOSIS — E782 Mixed hyperlipidemia: Secondary | ICD-10-CM | POA: Diagnosis not present

## 2018-08-16 DIAGNOSIS — R350 Frequency of micturition: Secondary | ICD-10-CM | POA: Diagnosis not present

## 2018-08-16 DIAGNOSIS — E1165 Type 2 diabetes mellitus with hyperglycemia: Secondary | ICD-10-CM | POA: Diagnosis not present

## 2018-08-16 DIAGNOSIS — R1013 Epigastric pain: Secondary | ICD-10-CM | POA: Diagnosis not present

## 2018-08-16 DIAGNOSIS — Z6837 Body mass index (BMI) 37.0-37.9, adult: Secondary | ICD-10-CM | POA: Diagnosis not present

## 2018-08-29 ENCOUNTER — Other Ambulatory Visit: Payer: Self-pay

## 2018-09-14 DIAGNOSIS — E782 Mixed hyperlipidemia: Secondary | ICD-10-CM | POA: Diagnosis not present

## 2018-09-14 DIAGNOSIS — I10 Essential (primary) hypertension: Secondary | ICD-10-CM | POA: Diagnosis not present

## 2018-09-23 ENCOUNTER — Encounter: Payer: Self-pay | Admitting: *Deleted

## 2018-09-30 ENCOUNTER — Encounter: Payer: Self-pay | Admitting: Cardiology

## 2018-10-03 ENCOUNTER — Other Ambulatory Visit: Payer: Self-pay

## 2018-10-07 ENCOUNTER — Other Ambulatory Visit: Payer: Self-pay | Admitting: *Deleted

## 2018-10-07 NOTE — Patient Outreach (Addendum)
[facility]) Care Management  10/07/2018  Harvin Konicek 1933/02/24 [id number]  Subjective: Per patient's 02/22/2017 Designated Party Release on file for patient's wife Willies Laviolette.  Telephone call to patient's home / mobile number, spoke with patient's wife Masashi Snowdon, stated patient's name, date of birth, and address.   Discussed [facility] Care Management [employer] Nurse Call Line follow up, wife voiced understanding, and is in agreement to follow up on patient's behalf.   Wife states patient is doing better, no issues with blood sugar at this time, she has been in contact with pharmacist Thressa Sheller at [facility]' office, Margarita Grizzle is working on obtaining another blood sugar monitor that attaches to patient's arm, and obtaining caregiver assistance.   Wife states she is having a hard time getting rest and looking into obtaining additional assistance.   Discussed home services would require skilled nursing for home health aide services, insurance approval,  MD order, wife voices understanding, she will follow up with Margarita Grizzle at provider's for update on assistance, and blood sugar monitor.   Wife states patient does not have any education material, transition of care, care coordination, disease management, disease monitoring, transportation, community resource, or pharmacy needs at this time.  Wife declined [place] [facility] services at this time and will follow up if services needed in the future.   States she is very appreciative of the follow up.     Objective: Per KPN (Knowledge Performance Now, point of care tool) and chart review, patient has had no recent hospitalization or ED visits.  Patient has a history of diabetes, CAD, Head Injury, Pacemaker, and Dyslipidemia.      Assessment: Received [employer] Nurse Call Line follow up referral on 10/07/2018.  Referral reason: Wife called in patient with low  Blood sugar.   Nurse line follow up completed and no  further care management needs.        Plan: RNCM will complete case closure due to follow up completed / no care management needs.           Raykwon Hobbs H. Annia Friendly, BSN, [place] Management [facility] Telephonic CM Phone: [phone] Fax: [phone]

## 2018-10-15 DIAGNOSIS — I10 Essential (primary) hypertension: Secondary | ICD-10-CM | POA: Diagnosis not present

## 2018-10-15 DIAGNOSIS — E1129 Type 2 diabetes mellitus with other diabetic kidney complication: Secondary | ICD-10-CM | POA: Diagnosis not present

## 2018-10-16 DIAGNOSIS — Z881 Allergy status to other antibiotic agents status: Secondary | ICD-10-CM | POA: Diagnosis not present

## 2018-10-16 DIAGNOSIS — Z5112 Encounter for antineoplastic immunotherapy: Secondary | ICD-10-CM | POA: Diagnosis not present

## 2018-10-16 DIAGNOSIS — C787 Secondary malignant neoplasm of liver and intrahepatic bile duct: Secondary | ICD-10-CM | POA: Diagnosis not present

## 2018-10-16 DIAGNOSIS — E538 Deficiency of other specified B group vitamins: Secondary | ICD-10-CM | POA: Diagnosis not present

## 2018-10-16 DIAGNOSIS — M1990 Unspecified osteoarthritis, unspecified site: Secondary | ICD-10-CM | POA: Diagnosis not present

## 2018-10-16 DIAGNOSIS — E279 Disorder of adrenal gland, unspecified: Secondary | ICD-10-CM | POA: Diagnosis not present

## 2018-10-16 DIAGNOSIS — C44329 Squamous cell carcinoma of skin of other parts of face: Secondary | ICD-10-CM | POA: Diagnosis not present

## 2018-10-16 DIAGNOSIS — Z885 Allergy status to narcotic agent status: Secondary | ICD-10-CM | POA: Diagnosis not present

## 2018-10-16 DIAGNOSIS — L814 Other melanin hyperpigmentation: Secondary | ICD-10-CM | POA: Diagnosis not present

## 2018-10-16 DIAGNOSIS — Z79899 Other long term (current) drug therapy: Secondary | ICD-10-CM | POA: Diagnosis not present

## 2018-10-16 DIAGNOSIS — D1801 Hemangioma of skin and subcutaneous tissue: Secondary | ICD-10-CM | POA: Diagnosis not present

## 2018-10-16 DIAGNOSIS — I10 Essential (primary) hypertension: Secondary | ICD-10-CM | POA: Diagnosis not present

## 2018-10-16 DIAGNOSIS — C439 Malignant melanoma of skin, unspecified: Secondary | ICD-10-CM | POA: Diagnosis not present

## 2018-10-17 DIAGNOSIS — R262 Difficulty in walking, not elsewhere classified: Secondary | ICD-10-CM | POA: Diagnosis not present

## 2018-10-17 DIAGNOSIS — I872 Venous insufficiency (chronic) (peripheral): Secondary | ICD-10-CM | POA: Diagnosis not present

## 2018-10-17 DIAGNOSIS — Z87891 Personal history of nicotine dependence: Secondary | ICD-10-CM | POA: Diagnosis not present

## 2018-10-17 DIAGNOSIS — L97512 Non-pressure chronic ulcer of other part of right foot with fat layer exposed: Secondary | ICD-10-CM | POA: Diagnosis not present

## 2018-10-17 DIAGNOSIS — M25662 Stiffness of left knee, not elsewhere classified: Secondary | ICD-10-CM | POA: Diagnosis not present

## 2018-10-17 DIAGNOSIS — L950 Livedoid vasculitis: Secondary | ICD-10-CM | POA: Diagnosis not present

## 2018-10-17 DIAGNOSIS — M25562 Pain in left knee: Secondary | ICD-10-CM | POA: Diagnosis not present

## 2018-10-17 DIAGNOSIS — E1129 Type 2 diabetes mellitus with other diabetic kidney complication: Secondary | ICD-10-CM | POA: Diagnosis not present

## 2018-10-17 DIAGNOSIS — Z96652 Presence of left artificial knee joint: Secondary | ICD-10-CM | POA: Diagnosis not present

## 2018-10-22 DIAGNOSIS — E039 Hypothyroidism, unspecified: Secondary | ICD-10-CM | POA: Diagnosis not present

## 2018-10-22 DIAGNOSIS — F0280 Dementia in other diseases classified elsewhere without behavioral disturbance: Secondary | ICD-10-CM | POA: Diagnosis not present

## 2018-10-22 DIAGNOSIS — I10 Essential (primary) hypertension: Secondary | ICD-10-CM | POA: Diagnosis not present

## 2018-10-22 DIAGNOSIS — R809 Proteinuria, unspecified: Secondary | ICD-10-CM | POA: Diagnosis not present

## 2018-10-22 DIAGNOSIS — Z794 Long term (current) use of insulin: Secondary | ICD-10-CM | POA: Diagnosis not present

## 2018-10-22 DIAGNOSIS — G301 Alzheimer's disease with late onset: Secondary | ICD-10-CM | POA: Diagnosis not present

## 2018-10-22 DIAGNOSIS — E782 Mixed hyperlipidemia: Secondary | ICD-10-CM | POA: Diagnosis not present

## 2018-10-22 DIAGNOSIS — E1129 Type 2 diabetes mellitus with other diabetic kidney complication: Secondary | ICD-10-CM | POA: Diagnosis not present

## 2018-11-13 ENCOUNTER — Telehealth: Payer: Self-pay | Admitting: *Deleted

## 2018-11-13 NOTE — Telephone Encounter (Signed)
Called to get device information to transfer PPM follow up to our clinic.  Spoke to pt's wife (DPR on file) as pt has dementia.  PPM-MDT MN: [id number] SN: [id number]  Transmission home remote box: MN: [id number] SN: [id number]  Will forward to our device clinic to transfer device. Will ask device clinic to notify me once transferred and remote received so that I may schedule OV w/ Camnitz to establish device care.

## 2018-11-13 NOTE — Telephone Encounter (Signed)
I requested the pt be transferred into our clinic in [employer]

## 2018-11-14 DIAGNOSIS — I10 Essential (primary) hypertension: Secondary | ICD-10-CM | POA: Diagnosis not present

## 2018-11-14 DIAGNOSIS — E782 Mixed hyperlipidemia: Secondary | ICD-10-CM | POA: Diagnosis not present

## 2018-11-14 NOTE — Telephone Encounter (Signed)
Scheduled for 5/28

## 2018-11-14 NOTE — Telephone Encounter (Signed)
Pt transferred in through carelink into our clinic

## 2018-12-06 DIAGNOSIS — Z8781 Personal history of (healed) traumatic fracture: Secondary | ICD-10-CM | POA: Diagnosis not present

## 2018-12-06 DIAGNOSIS — Z86711 Personal history of pulmonary embolism: Secondary | ICD-10-CM | POA: Diagnosis not present

## 2018-12-06 DIAGNOSIS — E1129 Type 2 diabetes mellitus with other diabetic kidney complication: Secondary | ICD-10-CM | POA: Diagnosis not present

## 2018-12-06 DIAGNOSIS — E782 Mixed hyperlipidemia: Secondary | ICD-10-CM | POA: Diagnosis not present

## 2018-12-06 DIAGNOSIS — Z9181 History of falling: Secondary | ICD-10-CM | POA: Diagnosis not present

## 2018-12-06 DIAGNOSIS — Z950 Presence of cardiac pacemaker: Secondary | ICD-10-CM | POA: Diagnosis not present

## 2018-12-06 DIAGNOSIS — G301 Alzheimer's disease with late onset: Secondary | ICD-10-CM | POA: Diagnosis not present

## 2018-12-06 DIAGNOSIS — M109 Gout, unspecified: Secondary | ICD-10-CM | POA: Diagnosis not present

## 2018-12-06 DIAGNOSIS — I10 Essential (primary) hypertension: Secondary | ICD-10-CM | POA: Diagnosis not present

## 2018-12-06 DIAGNOSIS — F0280 Dementia in other diseases classified elsewhere without behavioral disturbance: Secondary | ICD-10-CM | POA: Diagnosis not present

## 2018-12-06 DIAGNOSIS — E039 Hypothyroidism, unspecified: Secondary | ICD-10-CM | POA: Diagnosis not present

## 2018-12-12 ENCOUNTER — Encounter: Payer: PPO | Admitting: *Deleted

## 2018-12-13 ENCOUNTER — Telehealth: Payer: Self-pay

## 2018-12-13 NOTE — Telephone Encounter (Signed)
Spoke with patient to remind of missed remote transmission 

## 2018-12-14 DIAGNOSIS — I10 Essential (primary) hypertension: Secondary | ICD-10-CM | POA: Diagnosis not present

## 2018-12-14 DIAGNOSIS — E782 Mixed hyperlipidemia: Secondary | ICD-10-CM | POA: Diagnosis not present

## 2018-12-16 ENCOUNTER — Telehealth: Payer: Self-pay | Admitting: *Deleted

## 2018-12-16 ENCOUNTER — Telehealth: Payer: Self-pay

## 2018-12-16 DIAGNOSIS — E1165 Type 2 diabetes mellitus with hyperglycemia: Secondary | ICD-10-CM | POA: Diagnosis not present

## 2018-12-16 DIAGNOSIS — E1129 Type 2 diabetes mellitus with other diabetic kidney complication: Secondary | ICD-10-CM | POA: Diagnosis not present

## 2018-12-16 NOTE — Telephone Encounter (Signed)
Got hold of pt's wife and transferred to [facility] in the device clinic at [address]

## 2018-12-16 NOTE — Telephone Encounter (Signed)
Pt's wife calling about pace maker check. When she went to check pace maker she was not able to get it to work. She thinks maybe the battery is dead. PLease advise. Was going to transfer to device clinic at [phone] but pt was not there when I came back to call. Called back and left message.

## 2018-12-16 NOTE — Telephone Encounter (Signed)
Pt wife was trying to send a transmission with the pt home monitor. I tried to help her but was unsuccessful. I gave her the number to carelink tech support to get further assistance.

## 2018-12-19 ENCOUNTER — Encounter: Payer: Self-pay | Admitting: Cardiology

## 2018-12-27 ENCOUNTER — Telehealth: Payer: Self-pay | Admitting: *Deleted

## 2018-12-27 ENCOUNTER — Telehealth: Payer: Self-pay

## 2018-12-27 NOTE — Telephone Encounter (Signed)
The pt wife tried to send a manual transmission for the pt again and was unsuccessful again. I called Carelink tech support to get the pt wife some additional support. The pt wife states the pt also felt some tickling at his ppm site last night. The tech support technician advised the pt to let the handle charge for 1 hour and it still do not work to call stay connect to trouble shoot the monitor.

## 2018-12-27 NOTE — Telephone Encounter (Signed)
Pt's wife calling again about needing help with device. Please advise.

## 2018-12-31 DIAGNOSIS — F0280 Dementia in other diseases classified elsewhere without behavioral disturbance: Secondary | ICD-10-CM | POA: Diagnosis not present

## 2018-12-31 DIAGNOSIS — E782 Mixed hyperlipidemia: Secondary | ICD-10-CM | POA: Diagnosis not present

## 2018-12-31 DIAGNOSIS — E1165 Type 2 diabetes mellitus with hyperglycemia: Secondary | ICD-10-CM | POA: Diagnosis not present

## 2019-01-02 ENCOUNTER — Telehealth: Payer: Self-pay

## 2019-01-02 ENCOUNTER — Ambulatory Visit (INDEPENDENT_AMBULATORY_CARE_PROVIDER_SITE_OTHER): Payer: PPO

## 2019-01-02 ENCOUNTER — Other Ambulatory Visit: Payer: Self-pay

## 2019-01-02 DIAGNOSIS — Z950 Presence of cardiac pacemaker: Secondary | ICD-10-CM | POA: Diagnosis not present

## 2019-01-02 DIAGNOSIS — R001 Bradycardia, unspecified: Secondary | ICD-10-CM | POA: Diagnosis not present

## 2019-01-02 DIAGNOSIS — I10 Essential (primary) hypertension: Secondary | ICD-10-CM | POA: Diagnosis not present

## 2019-01-02 NOTE — Progress Notes (Signed)
2D Echocardiogram performed   01/02/19 Cardell Peach RDCS, RVT

## 2019-01-02 NOTE — Telephone Encounter (Signed)
Spoke with patient's wife, Shauna Hugh. She attempted to send a transmission through pt's monitor twice, but both times got error messages. Contacted number she had for [employer] tech services, but got busy signal. Advised I will send an electronic request for Carelink Stay Connected, should contact pt within 1-2 business days. She requests a call in the afternoon between 3:30-5:30pm. She denies additional questions or concerns at this time.  Attempted to submit request, but site is currently down. Will try again tomorrow.

## 2019-01-02 NOTE — Telephone Encounter (Signed)
The patient was in the office today. It sounds like she has talked to the devise rep, but has not talked to anyone since. She states that they received a signal that was received but got dropped. She is concerned about him being checked since it has been so long.

## 2019-01-03 NOTE — Telephone Encounter (Signed)
Spoke with [employer] Stay Connected rep--they will contact pt/wife for monitor troubleshooting assistance.

## 2019-01-05 DIAGNOSIS — Z86711 Personal history of pulmonary embolism: Secondary | ICD-10-CM | POA: Diagnosis not present

## 2019-01-05 DIAGNOSIS — E1129 Type 2 diabetes mellitus with other diabetic kidney complication: Secondary | ICD-10-CM | POA: Diagnosis not present

## 2019-01-05 DIAGNOSIS — E039 Hypothyroidism, unspecified: Secondary | ICD-10-CM | POA: Diagnosis not present

## 2019-01-05 DIAGNOSIS — E782 Mixed hyperlipidemia: Secondary | ICD-10-CM | POA: Diagnosis not present

## 2019-01-05 DIAGNOSIS — I10 Essential (primary) hypertension: Secondary | ICD-10-CM | POA: Diagnosis not present

## 2019-01-05 DIAGNOSIS — G301 Alzheimer's disease with late onset: Secondary | ICD-10-CM | POA: Diagnosis not present

## 2019-01-05 DIAGNOSIS — Z8781 Personal history of (healed) traumatic fracture: Secondary | ICD-10-CM | POA: Diagnosis not present

## 2019-01-05 DIAGNOSIS — Z950 Presence of cardiac pacemaker: Secondary | ICD-10-CM | POA: Diagnosis not present

## 2019-01-05 DIAGNOSIS — Z9181 History of falling: Secondary | ICD-10-CM | POA: Diagnosis not present

## 2019-01-05 DIAGNOSIS — F0280 Dementia in other diseases classified elsewhere without behavioral disturbance: Secondary | ICD-10-CM | POA: Diagnosis not present

## 2019-01-05 DIAGNOSIS — M109 Gout, unspecified: Secondary | ICD-10-CM | POA: Diagnosis not present

## 2019-01-06 NOTE — Telephone Encounter (Signed)
I spoke with pt wife to follow up if she talked to [employer] stay connected she states no. I told her per Raquel Sarna phone note that she going to have [employer] stay connected call her about the monitor. Pt wife verbalized understanding.

## 2019-01-08 DIAGNOSIS — N3001 Acute cystitis with hematuria: Secondary | ICD-10-CM | POA: Diagnosis not present

## 2019-01-09 DIAGNOSIS — N400 Enlarged prostate without lower urinary tract symptoms: Secondary | ICD-10-CM | POA: Diagnosis not present

## 2019-01-09 DIAGNOSIS — E1122 Type 2 diabetes mellitus with diabetic chronic kidney disease: Secondary | ICD-10-CM | POA: Diagnosis not present

## 2019-01-09 DIAGNOSIS — D631 Anemia in chronic kidney disease: Secondary | ICD-10-CM | POA: Diagnosis not present

## 2019-01-09 DIAGNOSIS — N2581 Secondary hyperparathyroidism of renal origin: Secondary | ICD-10-CM | POA: Diagnosis not present

## 2019-01-09 DIAGNOSIS — I129 Hypertensive chronic kidney disease with stage 1 through stage 4 chronic kidney disease, or unspecified chronic kidney disease: Secondary | ICD-10-CM | POA: Diagnosis not present

## 2019-01-09 DIAGNOSIS — N189 Chronic kidney disease, unspecified: Secondary | ICD-10-CM | POA: Diagnosis not present

## 2019-01-09 DIAGNOSIS — E785 Hyperlipidemia, unspecified: Secondary | ICD-10-CM | POA: Diagnosis not present

## 2019-01-09 DIAGNOSIS — M109 Gout, unspecified: Secondary | ICD-10-CM | POA: Diagnosis not present

## 2019-01-09 DIAGNOSIS — N183 Chronic kidney disease, stage 3 (moderate): Secondary | ICD-10-CM | POA: Diagnosis not present

## 2019-01-09 DIAGNOSIS — N179 Acute kidney failure, unspecified: Secondary | ICD-10-CM | POA: Diagnosis not present

## 2019-01-10 DIAGNOSIS — R262 Difficulty in walking, not elsewhere classified: Secondary | ICD-10-CM | POA: Diagnosis not present

## 2019-01-10 NOTE — Telephone Encounter (Signed)
I called to see if the pt wife spoke to [employer] Stay Connected to get the pt monitor fix. LMOVM to call the DC back.

## 2019-01-10 NOTE — Telephone Encounter (Signed)
Pt wife wants the pt to be seen by Dr. Curt Bears to have his pacer check. I have been trying to help the pt wife to send the transmission however the pt monitor is not working. I gave her the number to [place] support to get additional help but she do not like calling them because there is a long wait time. I have sent [employer] a message to call the pt to help trouble shoot the monitor or to determine if the pt needs a new monitor. The pt wife states she is interested in a virtual visit if she can get one. I told her the pt might have to come to the [address] office for his initial visit with Dr. Curt Bears.

## 2019-01-14 DIAGNOSIS — I10 Essential (primary) hypertension: Secondary | ICD-10-CM | POA: Diagnosis not present

## 2019-01-14 DIAGNOSIS — E782 Mixed hyperlipidemia: Secondary | ICD-10-CM | POA: Diagnosis not present

## 2019-01-15 DIAGNOSIS — E119 Type 2 diabetes mellitus without complications: Secondary | ICD-10-CM | POA: Diagnosis not present

## 2019-01-15 NOTE — Telephone Encounter (Signed)
01-15-19 called pt at 227pm, wife asnswered. I explained I was calling d/t trouble with pt sending in pacer transmission and wife requesting an in office visit. She said they were on the way to an eye doc appt and were running late, but that she wanted the appt over the phone. I offered to hang up and call back and leave my contact info, and  I told her we wouldn't have any information to discuss at a virtual visit w/o having the pacer transmission results. She said just call me back. I called pt back reexplaining that we either need the transmission sent, she could contact Carelink tech support to walk them though it, or come into the office on [address] in July or [place] In August. I left my direct number for wife to call me back and let me know how/where they want the appt.

## 2019-01-30 DIAGNOSIS — N3001 Acute cystitis with hematuria: Secondary | ICD-10-CM | POA: Diagnosis not present

## 2019-02-04 DIAGNOSIS — Z8781 Personal history of (healed) traumatic fracture: Secondary | ICD-10-CM | POA: Diagnosis not present

## 2019-02-04 DIAGNOSIS — Z9181 History of falling: Secondary | ICD-10-CM | POA: Diagnosis not present

## 2019-02-04 DIAGNOSIS — Z950 Presence of cardiac pacemaker: Secondary | ICD-10-CM | POA: Diagnosis not present

## 2019-02-04 DIAGNOSIS — E039 Hypothyroidism, unspecified: Secondary | ICD-10-CM | POA: Diagnosis not present

## 2019-02-04 DIAGNOSIS — E782 Mixed hyperlipidemia: Secondary | ICD-10-CM | POA: Diagnosis not present

## 2019-02-04 DIAGNOSIS — G301 Alzheimer's disease with late onset: Secondary | ICD-10-CM | POA: Diagnosis not present

## 2019-02-04 DIAGNOSIS — M109 Gout, unspecified: Secondary | ICD-10-CM | POA: Diagnosis not present

## 2019-02-04 DIAGNOSIS — F0280 Dementia in other diseases classified elsewhere without behavioral disturbance: Secondary | ICD-10-CM | POA: Diagnosis not present

## 2019-02-04 DIAGNOSIS — Z86711 Personal history of pulmonary embolism: Secondary | ICD-10-CM | POA: Diagnosis not present

## 2019-02-04 DIAGNOSIS — E1129 Type 2 diabetes mellitus with other diabetic kidney complication: Secondary | ICD-10-CM | POA: Diagnosis not present

## 2019-02-04 DIAGNOSIS — I10 Essential (primary) hypertension: Secondary | ICD-10-CM | POA: Diagnosis not present

## 2019-02-07 DIAGNOSIS — Z125 Encounter for screening for malignant neoplasm of prostate: Secondary | ICD-10-CM | POA: Diagnosis not present

## 2019-02-07 DIAGNOSIS — E1165 Type 2 diabetes mellitus with hyperglycemia: Secondary | ICD-10-CM | POA: Diagnosis not present

## 2019-02-10 DIAGNOSIS — R809 Proteinuria, unspecified: Secondary | ICD-10-CM | POA: Diagnosis not present

## 2019-02-10 DIAGNOSIS — Z794 Long term (current) use of insulin: Secondary | ICD-10-CM | POA: Diagnosis not present

## 2019-02-10 DIAGNOSIS — E1129 Type 2 diabetes mellitus with other diabetic kidney complication: Secondary | ICD-10-CM | POA: Diagnosis not present

## 2019-02-10 DIAGNOSIS — N3001 Acute cystitis with hematuria: Secondary | ICD-10-CM | POA: Diagnosis not present

## 2019-02-10 DIAGNOSIS — I10 Essential (primary) hypertension: Secondary | ICD-10-CM | POA: Diagnosis not present

## 2019-02-11 DIAGNOSIS — E1129 Type 2 diabetes mellitus with other diabetic kidney complication: Secondary | ICD-10-CM | POA: Diagnosis not present

## 2019-02-11 DIAGNOSIS — G301 Alzheimer's disease with late onset: Secondary | ICD-10-CM | POA: Diagnosis not present

## 2019-02-13 DIAGNOSIS — N183 Chronic kidney disease, stage 3 (moderate): Secondary | ICD-10-CM | POA: Diagnosis not present

## 2019-02-13 DIAGNOSIS — N179 Acute kidney failure, unspecified: Secondary | ICD-10-CM | POA: Diagnosis not present

## 2019-02-14 DIAGNOSIS — E782 Mixed hyperlipidemia: Secondary | ICD-10-CM | POA: Diagnosis not present

## 2019-02-14 DIAGNOSIS — I10 Essential (primary) hypertension: Secondary | ICD-10-CM | POA: Diagnosis not present

## 2019-02-14 DIAGNOSIS — E1129 Type 2 diabetes mellitus with other diabetic kidney complication: Secondary | ICD-10-CM | POA: Diagnosis not present

## 2019-02-18 DIAGNOSIS — E1129 Type 2 diabetes mellitus with other diabetic kidney complication: Secondary | ICD-10-CM | POA: Diagnosis not present

## 2019-03-06 DIAGNOSIS — Z9181 History of falling: Secondary | ICD-10-CM | POA: Diagnosis not present

## 2019-03-06 DIAGNOSIS — I10 Essential (primary) hypertension: Secondary | ICD-10-CM | POA: Diagnosis not present

## 2019-03-06 DIAGNOSIS — E039 Hypothyroidism, unspecified: Secondary | ICD-10-CM | POA: Diagnosis not present

## 2019-03-06 DIAGNOSIS — Z86711 Personal history of pulmonary embolism: Secondary | ICD-10-CM | POA: Diagnosis not present

## 2019-03-06 DIAGNOSIS — M109 Gout, unspecified: Secondary | ICD-10-CM | POA: Diagnosis not present

## 2019-03-06 DIAGNOSIS — G301 Alzheimer's disease with late onset: Secondary | ICD-10-CM | POA: Diagnosis not present

## 2019-03-06 DIAGNOSIS — Z950 Presence of cardiac pacemaker: Secondary | ICD-10-CM | POA: Diagnosis not present

## 2019-03-06 DIAGNOSIS — E782 Mixed hyperlipidemia: Secondary | ICD-10-CM | POA: Diagnosis not present

## 2019-03-06 DIAGNOSIS — Z8781 Personal history of (healed) traumatic fracture: Secondary | ICD-10-CM | POA: Diagnosis not present

## 2019-03-06 DIAGNOSIS — F0280 Dementia in other diseases classified elsewhere without behavioral disturbance: Secondary | ICD-10-CM | POA: Diagnosis not present

## 2019-03-06 DIAGNOSIS — E1129 Type 2 diabetes mellitus with other diabetic kidney complication: Secondary | ICD-10-CM | POA: Diagnosis not present

## 2019-03-17 DIAGNOSIS — I10 Essential (primary) hypertension: Secondary | ICD-10-CM | POA: Diagnosis not present

## 2019-03-17 DIAGNOSIS — E1129 Type 2 diabetes mellitus with other diabetic kidney complication: Secondary | ICD-10-CM | POA: Diagnosis not present

## 2019-03-17 DIAGNOSIS — E782 Mixed hyperlipidemia: Secondary | ICD-10-CM | POA: Diagnosis not present

## 2019-03-27 DIAGNOSIS — I129 Hypertensive chronic kidney disease with stage 1 through stage 4 chronic kidney disease, or unspecified chronic kidney disease: Secondary | ICD-10-CM | POA: Diagnosis not present

## 2019-03-27 DIAGNOSIS — F0390 Unspecified dementia without behavioral disturbance: Secondary | ICD-10-CM | POA: Diagnosis not present

## 2019-03-27 DIAGNOSIS — S99921A Unspecified injury of right foot, initial encounter: Secondary | ICD-10-CM | POA: Diagnosis not present

## 2019-03-27 DIAGNOSIS — M1990 Unspecified osteoarthritis, unspecified site: Secondary | ICD-10-CM | POA: Diagnosis not present

## 2019-03-27 DIAGNOSIS — N189 Chronic kidney disease, unspecified: Secondary | ICD-10-CM | POA: Diagnosis not present

## 2019-03-27 DIAGNOSIS — M85871 Other specified disorders of bone density and structure, right ankle and foot: Secondary | ICD-10-CM | POA: Diagnosis not present

## 2019-03-27 DIAGNOSIS — Z950 Presence of cardiac pacemaker: Secondary | ICD-10-CM | POA: Diagnosis not present

## 2019-03-27 DIAGNOSIS — I4891 Unspecified atrial fibrillation: Secondary | ICD-10-CM | POA: Diagnosis not present

## 2019-03-27 DIAGNOSIS — Z794 Long term (current) use of insulin: Secondary | ICD-10-CM | POA: Diagnosis not present

## 2019-03-27 DIAGNOSIS — Z87891 Personal history of nicotine dependence: Secondary | ICD-10-CM | POA: Diagnosis not present

## 2019-03-27 DIAGNOSIS — E1122 Type 2 diabetes mellitus with diabetic chronic kidney disease: Secondary | ICD-10-CM | POA: Diagnosis not present

## 2019-03-27 DIAGNOSIS — Z79899 Other long term (current) drug therapy: Secondary | ICD-10-CM | POA: Diagnosis not present

## 2019-03-27 DIAGNOSIS — M79671 Pain in right foot: Secondary | ICD-10-CM | POA: Diagnosis not present

## 2019-03-27 DIAGNOSIS — M8580 Other specified disorders of bone density and structure, unspecified site: Secondary | ICD-10-CM | POA: Diagnosis not present

## 2019-03-31 ENCOUNTER — Ambulatory Visit (INDEPENDENT_AMBULATORY_CARE_PROVIDER_SITE_OTHER): Payer: PPO | Admitting: Cardiology

## 2019-03-31 ENCOUNTER — Telehealth: Payer: Self-pay | Admitting: *Deleted

## 2019-03-31 ENCOUNTER — Encounter: Payer: Self-pay | Admitting: Cardiology

## 2019-03-31 ENCOUNTER — Other Ambulatory Visit: Payer: Self-pay

## 2019-03-31 VITALS — BP 106/58 | HR 69 | Ht 67.0 in | Wt 229.0 lb

## 2019-03-31 DIAGNOSIS — Z950 Presence of cardiac pacemaker: Secondary | ICD-10-CM | POA: Diagnosis not present

## 2019-03-31 DIAGNOSIS — R001 Bradycardia, unspecified: Secondary | ICD-10-CM | POA: Diagnosis not present

## 2019-03-31 LAB — CUP PACEART INCLINIC DEVICE CHECK
Battery Impedance: 1020 Ohm
Battery Remaining Longevity: 66 mo
Battery Voltage: 2.78 V
Brady Statistic AP VP Percent: 13 %
Brady Statistic AP VS Percent: 62 %
Brady Statistic AS VP Percent: 3 %
Brady Statistic AS VS Percent: 23 %
Date Time Interrogation Session: 20200914144611
Implantable Lead Implant Date: 20051101
Implantable Lead Implant Date: 20051101
Implantable Lead Location: 753859
Implantable Lead Location: 753860
Implantable Lead Model: 4092
Implantable Lead Model: 5076
Implantable Pulse Generator Implant Date: 20111107
Lead Channel Impedance Value: 412 Ohm
Lead Channel Impedance Value: 706 Ohm
Lead Channel Pacing Threshold Amplitude: 0.5 V
Lead Channel Pacing Threshold Amplitude: 0.75 V
Lead Channel Pacing Threshold Pulse Width: 0.4 ms
Lead Channel Pacing Threshold Pulse Width: 0.4 ms
Lead Channel Sensing Intrinsic Amplitude: 11.2 mV
Lead Channel Sensing Intrinsic Amplitude: 2.8 mV
Lead Channel Setting Pacing Amplitude: 2 V
Lead Channel Setting Pacing Amplitude: 2.25 V
Lead Channel Setting Pacing Pulse Width: 0.4 ms
Lead Channel Setting Sensing Sensitivity: 4 mV

## 2019-03-31 NOTE — Progress Notes (Signed)
Electrophysiology Office Note   Date:  03/31/2019   ID:  Victor Murphy, DOB 10/11/1932, MRN [id number]  PCP:  Myer Peer, MD  Cardiologist:  Agustin Cree Primary Electrophysiologist:  Natane Heward Meredith Leeds, MD    Chief Complaint: pacemaker   History of Present Illness: Victor Murphy is a 85 y.o. male who is being seen today for the evaluation of pacemaker at the request of Park Liter, MD. Presenting today for electrophysiology evaluation.  History of coronary artery disease, diabetes, and a pacemaker.  He also has progressive dementia.  Today, he denies symptoms of palpitations, chest pain, shortness of breath, orthopnea, PND, lower extremity edema, claudication, dizziness, presyncope, syncope, bleeding, or neurologic sequela. The patient is tolerating medications without difficulties.  He apparently sits in a wheelchair most of the time.  He is not very active at the moment.   Past Medical History:  Diagnosis Date  . Coronary artery disease   . Diabetes mellitus without complication ([place])   . Head injury    Past Surgical History:  Procedure Laterality Date  . APPENDECTOMY    . broken right leg    . INSERT / REPLACE / REMOVE PACEMAKER     Medtronic  . KNEE ARTHROSCOPY    . PACEMAKER INSERTION       Current Outpatient Medications  Medication Sig Dispense Refill  . Coenzyme Q10 (COQ10) 30 MG CAPS Take 100 mg by mouth daily.    . divalproex (DEPAKOTE) 250 MG DR tablet Take 250 mg by mouth daily.    Marland Kitchen donepezil (ARICEPT) 10 MG tablet Take 10 mg by mouth.    . Insulin Degludec-Liraglutide (XULTOPHY) 100-3.6 UNIT-MG/ML SOPN Inject into the skin.    Marland Kitchen levothyroxine (SYNTHROID) 50 MCG tablet Take 50 mcg by mouth.     . memantine (NAMENDA) 10 MG tablet Take 10 mg by mouth 2 (two) times daily.     . Multiple Vitamin (MULTI-VITAMIN) tablet Take 1 tablet by mouth daily.    . rosuvastatin (CRESTOR) 20 MG tablet Take 20 mg by mouth daily.    Marland Kitchen TAMSULOSIN HCL PO Take 20 mg by  mouth daily.     No current facility-administered medications for this visit.     Allergies:   Tylenol [acetaminophen]   Social History:  The patient  reports that he has quit smoking. He has never used smokeless tobacco. He reports that he does not drink alcohol or use drugs.   Family History:  The patient's Family history is unknown by patient.    ROS:  Please see the history of present illness.   Otherwise, review of systems is positive for none.   All other systems are reviewed and negative.    PHYSICAL EXAM: VS:  BP (!) 106/58   Pulse 69   Ht 5\' 7"  (1.702 m)   Wt 229 lb (103.9 kg)   SpO2 95%   BMI 35.87 kg/m  , BMI Body mass index is 35.87 kg/m. GEN: Well nourished, well developed, in no acute distress  HEENT: normal  Neck: no JVD, carotid bruits, or masses Cardiac: RRR; no murmurs, rubs, or gallops,no edema  Respiratory:  clear to auscultation bilaterally, normal work of breathing GI: soft, nontender, nondistended, + BS MS: no deformity or atrophy  Skin: warm and dry, device pocket is well healed Neuro:  Strength and sensation are intact Psych: euthymic mood, full affect  EKG:  EKG is ordered today. Personal review of the ekg ordered shows paced, nonspecific T wave abnormalities, rate  [id number]  Device interrogation is reviewed today in detail.  See PaceArt for details.   Recent Labs: No results found for requested labs within last 8760 hours.    Lipid Panel  No results found for: CHOL, TRIG, HDL, CHOLHDL, VLDL, LDLCALC, LDLDIRECT   Wt Readings from Last 3 Encounters:  03/31/19 229 lb (103.9 kg)  07/22/18 230 lb (104.3 kg)  02/22/17 212 lb (96.2 kg)      Other studies Reviewed: Additional studies/ records that were reviewed today include: TTE 01/02/19  Review of the above records today demonstrates:   1. The left ventricle has normal systolic function with an ejection fraction of 60-65%. The cavity size was normal. There is moderately increased left  ventricular wall thickness. Left ventricular diastolic Doppler parameters are consistent with impaired  relaxation.  2. The right ventricle has normal systolic function. The cavity was normal. There is no increase in right ventricular wall thickness.  3. The aortic valve is tricuspid. Mild thickening of the aortic valve. Aortic valve regurgitation was not assessed by color flow Doppler.   ASSESSMENT AND PLAN:  1.  Sick sinus syndrome:  Status post Medtronic dual-chamber pacemaker.  Device functioning appropriately.  No changes at this time.  2.  Hypertension: Currently well controlled  3.  Coronary artery disease: No current chest pain.  Plan per primary cardiology.    Current medicines are reviewed at length with the patient today.   The patient does not have concerns regarding his medicines.  The following changes were made today:  none  Labs/ tests ordered today include:  Orders Placed This Encounter  Procedures  . EKG 12-Lead     Disposition:   FU with Lujain Kraszewski 1 year  Signed, Kinsie Belford Meredith Leeds, MD  03/31/2019 12:08 PM     [place] [address] [place] [address] [phone] (office) [phone] (fax)

## 2019-03-31 NOTE — Patient Instructions (Signed)
Medication Instructions:  Your physician recommends that you continue on your current medications as directed. Please refer to the Current Medication list given to you today.  *If you need a refill on your cardiac medications before your next appointment, please call your pharmacy*  Labwork: None ordered  Testing/Procedures: None ordered  Follow-Up: Remote monitoring is used to monitor your Pacemaker or ICD from home. This monitoring reduces the number of office visits required to check your device to one time per year. It allows [place] to keep an eye on the functioning of your device to ensure it is working properly. You are scheduled for a device check from home on 06/30/19. You may send your transmission at any time that day. If you have a wireless device, the transmission will be sent automatically. After your physician reviews your transmission, you will receive a postcard with your next transmission date.  Your physician wants you to follow-up in: 1 year with Dr. Curt Bears.  You will receive a reminder letter in the mail two months in advance. If you don't receive a letter, please call our office to schedule the follow-up appointment.  Thank you for choosing [facility]!!   Trinidad Curet, RN [phone]  Any Other Special Instructions Will Be Listed Below (If Applicable).

## 2019-03-31 NOTE — Telephone Encounter (Signed)
Spoke with patient's wife. Requested that she bring Carelink monitor to appointment with Dr. Curt Bears today at 11:30. Will attempt to troubleshoot monitor at visit. Pt's wife verbalizes understanding and agrees to bring monitor.

## 2019-04-01 NOTE — Telephone Encounter (Signed)
New Carelink monitor ordered 04/01/19 to patient's home address.

## 2019-04-10 DIAGNOSIS — M109 Gout, unspecified: Secondary | ICD-10-CM | POA: Diagnosis not present

## 2019-04-10 DIAGNOSIS — E1122 Type 2 diabetes mellitus with diabetic chronic kidney disease: Secondary | ICD-10-CM | POA: Diagnosis not present

## 2019-04-10 DIAGNOSIS — N179 Acute kidney failure, unspecified: Secondary | ICD-10-CM | POA: Diagnosis not present

## 2019-04-10 DIAGNOSIS — E785 Hyperlipidemia, unspecified: Secondary | ICD-10-CM | POA: Diagnosis not present

## 2019-04-10 DIAGNOSIS — N2581 Secondary hyperparathyroidism of renal origin: Secondary | ICD-10-CM | POA: Diagnosis not present

## 2019-04-10 DIAGNOSIS — N184 Chronic kidney disease, stage 4 (severe): Secondary | ICD-10-CM | POA: Diagnosis not present

## 2019-04-10 DIAGNOSIS — N390 Urinary tract infection, site not specified: Secondary | ICD-10-CM | POA: Diagnosis not present

## 2019-04-10 DIAGNOSIS — D631 Anemia in chronic kidney disease: Secondary | ICD-10-CM | POA: Diagnosis not present

## 2019-04-10 DIAGNOSIS — I129 Hypertensive chronic kidney disease with stage 1 through stage 4 chronic kidney disease, or unspecified chronic kidney disease: Secondary | ICD-10-CM | POA: Diagnosis not present

## 2019-04-10 DIAGNOSIS — N400 Enlarged prostate without lower urinary tract symptoms: Secondary | ICD-10-CM | POA: Diagnosis not present

## 2019-04-10 DIAGNOSIS — N189 Chronic kidney disease, unspecified: Secondary | ICD-10-CM | POA: Diagnosis not present

## 2019-04-16 DIAGNOSIS — E1129 Type 2 diabetes mellitus with other diabetic kidney complication: Secondary | ICD-10-CM | POA: Diagnosis not present

## 2019-04-16 DIAGNOSIS — I10 Essential (primary) hypertension: Secondary | ICD-10-CM | POA: Diagnosis not present

## 2019-04-17 DIAGNOSIS — Z23 Encounter for immunization: Secondary | ICD-10-CM | POA: Diagnosis not present

## 2019-04-21 DIAGNOSIS — N3020 Other chronic cystitis without hematuria: Secondary | ICD-10-CM | POA: Diagnosis not present

## 2019-04-21 DIAGNOSIS — R351 Nocturia: Secondary | ICD-10-CM | POA: Diagnosis not present

## 2019-04-21 DIAGNOSIS — N318 Other neuromuscular dysfunction of bladder: Secondary | ICD-10-CM | POA: Diagnosis not present

## 2019-05-16 DIAGNOSIS — I10 Essential (primary) hypertension: Secondary | ICD-10-CM | POA: Diagnosis not present

## 2019-05-16 DIAGNOSIS — E782 Mixed hyperlipidemia: Secondary | ICD-10-CM | POA: Diagnosis not present

## 2019-06-02 DIAGNOSIS — E1129 Type 2 diabetes mellitus with other diabetic kidney complication: Secondary | ICD-10-CM | POA: Diagnosis not present

## 2019-06-06 ENCOUNTER — Other Ambulatory Visit: Payer: Self-pay

## 2019-06-10 DIAGNOSIS — F0280 Dementia in other diseases classified elsewhere without behavioral disturbance: Secondary | ICD-10-CM | POA: Diagnosis not present

## 2019-06-10 DIAGNOSIS — G301 Alzheimer's disease with late onset: Secondary | ICD-10-CM | POA: Diagnosis not present

## 2019-06-10 DIAGNOSIS — E1129 Type 2 diabetes mellitus with other diabetic kidney complication: Secondary | ICD-10-CM | POA: Diagnosis not present

## 2019-06-10 DIAGNOSIS — Z1331 Encounter for screening for depression: Secondary | ICD-10-CM | POA: Diagnosis not present

## 2019-06-10 DIAGNOSIS — I10 Essential (primary) hypertension: Secondary | ICD-10-CM | POA: Diagnosis not present

## 2019-06-10 DIAGNOSIS — R809 Proteinuria, unspecified: Secondary | ICD-10-CM | POA: Diagnosis not present

## 2019-06-10 DIAGNOSIS — E039 Hypothyroidism, unspecified: Secondary | ICD-10-CM | POA: Diagnosis not present

## 2019-06-10 DIAGNOSIS — Z0000 Encounter for general adult medical examination without abnormal findings: Secondary | ICD-10-CM | POA: Diagnosis not present

## 2019-06-10 DIAGNOSIS — Z9181 History of falling: Secondary | ICD-10-CM | POA: Diagnosis not present

## 2019-06-10 DIAGNOSIS — N400 Enlarged prostate without lower urinary tract symptoms: Secondary | ICD-10-CM | POA: Diagnosis not present

## 2019-06-10 DIAGNOSIS — Z794 Long term (current) use of insulin: Secondary | ICD-10-CM | POA: Diagnosis not present

## 2019-06-10 DIAGNOSIS — E782 Mixed hyperlipidemia: Secondary | ICD-10-CM | POA: Diagnosis not present

## 2019-06-16 DIAGNOSIS — I10 Essential (primary) hypertension: Secondary | ICD-10-CM | POA: Diagnosis not present

## 2019-06-16 DIAGNOSIS — E039 Hypothyroidism, unspecified: Secondary | ICD-10-CM | POA: Diagnosis not present

## 2019-06-16 DIAGNOSIS — E782 Mixed hyperlipidemia: Secondary | ICD-10-CM | POA: Diagnosis not present

## 2019-06-24 DIAGNOSIS — M79604 Pain in right leg: Secondary | ICD-10-CM | POA: Diagnosis not present

## 2019-06-24 DIAGNOSIS — M25561 Pain in right knee: Secondary | ICD-10-CM | POA: Diagnosis not present

## 2019-06-30 ENCOUNTER — Ambulatory Visit (INDEPENDENT_AMBULATORY_CARE_PROVIDER_SITE_OTHER): Payer: PPO | Admitting: *Deleted

## 2019-06-30 DIAGNOSIS — R001 Bradycardia, unspecified: Secondary | ICD-10-CM

## 2019-07-02 LAB — CUP PACEART REMOTE DEVICE CHECK
Battery Impedance: 1155 Ohm
Battery Remaining Longevity: 58 mo
Battery Voltage: 2.77 V
Brady Statistic AP VP Percent: 11 %
Brady Statistic AP VS Percent: 70 %
Brady Statistic AS VP Percent: 1 %
Brady Statistic AS VS Percent: 18 %
Date Time Interrogation Session: 20201216130603
Implantable Lead Implant Date: 20051101
Implantable Lead Implant Date: 20051101
Implantable Lead Location: 753859
Implantable Lead Location: 753860
Implantable Lead Model: 4092
Implantable Lead Model: 5076
Implantable Pulse Generator Implant Date: 20111107
Lead Channel Impedance Value: 402 Ohm
Lead Channel Impedance Value: 686 Ohm
Lead Channel Pacing Threshold Amplitude: 0.5 V
Lead Channel Pacing Threshold Amplitude: 1.375 V
Lead Channel Pacing Threshold Pulse Width: 0.4 ms
Lead Channel Pacing Threshold Pulse Width: 0.4 ms
Lead Channel Setting Pacing Amplitude: 2 V
Lead Channel Setting Pacing Amplitude: 2.75 V
Lead Channel Setting Pacing Pulse Width: 0.4 ms
Lead Channel Setting Sensing Sensitivity: 2.8 mV

## 2019-07-17 DIAGNOSIS — E1143 Type 2 diabetes mellitus with diabetic autonomic (poly)neuropathy: Secondary | ICD-10-CM | POA: Diagnosis not present

## 2019-07-17 DIAGNOSIS — E7800 Pure hypercholesterolemia, unspecified: Secondary | ICD-10-CM | POA: Diagnosis not present

## 2019-07-17 DIAGNOSIS — I10 Essential (primary) hypertension: Secondary | ICD-10-CM | POA: Diagnosis not present

## 2019-08-16 DIAGNOSIS — M109 Gout, unspecified: Secondary | ICD-10-CM | POA: Diagnosis not present

## 2019-08-16 DIAGNOSIS — I10 Essential (primary) hypertension: Secondary | ICD-10-CM | POA: Diagnosis not present

## 2019-08-16 DIAGNOSIS — E782 Mixed hyperlipidemia: Secondary | ICD-10-CM | POA: Diagnosis not present

## 2019-09-03 ENCOUNTER — Telehealth: Payer: Self-pay | Admitting: Cardiology

## 2019-09-03 NOTE — Telephone Encounter (Signed)
Patient's wife calling stating the patient's pacemaker device checker was not working so they brought it into the office. She states [employer] is asking for them to send it back. She would like to know if it is still at the office. Please advise.

## 2019-09-04 NOTE — Telephone Encounter (Signed)
Left HIPPA compliant message on machine apologizing for the confusion and stated no further action should be required on her part. Left device clinic CB# to call if she had any further questions.   Legrand Como [address] [place], PA-C  09/04/2019 11:25 AM

## 2019-09-04 NOTE — Telephone Encounter (Signed)
Spoke to patient's wife.She stated when husband saw Dr.Camnitz at [facility] office she returned pacemaker device that was not working.She stated [employer]tronic sent her a box to mail back.Stated she does not have that she gave device to nurse at appointment to be returned.She wanted to make sure it was returned.She was concerned husband will be billed.Message sent to device pool.

## 2019-09-08 DIAGNOSIS — G301 Alzheimer's disease with late onset: Secondary | ICD-10-CM | POA: Diagnosis not present

## 2019-09-14 DIAGNOSIS — E785 Hyperlipidemia, unspecified: Secondary | ICD-10-CM | POA: Diagnosis not present

## 2019-09-14 DIAGNOSIS — I10 Essential (primary) hypertension: Secondary | ICD-10-CM | POA: Diagnosis not present

## 2019-10-01 ENCOUNTER — Telehealth: Payer: Self-pay

## 2019-10-01 NOTE — Telephone Encounter (Signed)
Left message for patient to remind of missed remote transmission.  

## 2019-10-31 ENCOUNTER — Telehealth: Payer: Self-pay | Admitting: *Deleted

## 2019-10-31 NOTE — Telephone Encounter (Signed)
Spoke with patient's wife, [name] ([place]). Per [name], patient is now receiving hospice care. He has few lucid moments and sleeps most of the time. [name] is requesting to discontinue remote monitoring and PPM f/u. Transmission appointments and recall for 03/2020 canceled. Advised to keep monitor for now in the event they wish to resume monitoring. [name] in agreement with plan and denies additional questions at this time. Message forwarded to Dr. Curt Bears as Juluis Rainier.

## 2019-11-15 DEATH — deceased
